# Patient Record
Sex: Male | Born: 1940 | Race: White | Hispanic: No | Marital: Married | Smoking: Never smoker
Health system: Southern US, Community
[De-identification: ages and names within clinical notes are randomized; demographics above are authoritative.]

## PROBLEM LIST (undated history)

## (undated) ENCOUNTER — Emergency Department (HOSPITAL_BASED_OUTPATIENT_CLINIC_OR_DEPARTMENT_OTHER): Admission: EM | Payer: Medicare HMO

## (undated) DIAGNOSIS — I2510 Atherosclerotic heart disease of native coronary artery without angina pectoris: Secondary | ICD-10-CM

## (undated) DIAGNOSIS — E785 Hyperlipidemia, unspecified: Secondary | ICD-10-CM

## (undated) DIAGNOSIS — H409 Unspecified glaucoma: Secondary | ICD-10-CM

## (undated) DIAGNOSIS — K589 Irritable bowel syndrome without diarrhea: Secondary | ICD-10-CM

## (undated) DIAGNOSIS — K8020 Calculus of gallbladder without cholecystitis without obstruction: Secondary | ICD-10-CM

## (undated) DIAGNOSIS — F419 Anxiety disorder, unspecified: Secondary | ICD-10-CM

## (undated) DIAGNOSIS — F32A Depression, unspecified: Secondary | ICD-10-CM

## (undated) DIAGNOSIS — E7800 Pure hypercholesterolemia, unspecified: Secondary | ICD-10-CM

## (undated) DIAGNOSIS — N200 Calculus of kidney: Secondary | ICD-10-CM

## (undated) HISTORY — PX: CORONARY STENT PLACEMENT: SHX1402

## (undated) HISTORY — DX: Anxiety disorder, unspecified: F41.9

## (undated) HISTORY — DX: Irritable bowel syndrome, unspecified: K58.9

## (undated) HISTORY — DX: Calculus of gallbladder without cholecystitis without obstruction: K80.20

## (undated) HISTORY — PX: BACK SURGERY: SHX140

## (undated) HISTORY — DX: Pure hypercholesterolemia, unspecified: E78.00

## (undated) HISTORY — DX: Depression, unspecified: F32.A

## (undated) HISTORY — DX: Calculus of kidney: N20.0

## (undated) HISTORY — PX: CHOLECYSTECTOMY: SHX55

## (undated) HISTORY — DX: Unspecified glaucoma: H40.9

## (undated) HISTORY — DX: Hyperlipidemia, unspecified: E78.5

## (undated) HISTORY — DX: Atherosclerotic heart disease of native coronary artery without angina pectoris: I25.10

---

## 2008-07-25 ENCOUNTER — Inpatient Hospital Stay (HOSPITAL_COMMUNITY): Admission: EM | Admit: 2008-07-25 | Discharge: 2008-07-27 | Payer: Self-pay | Admitting: Emergency Medicine

## 2008-07-25 ENCOUNTER — Ambulatory Visit: Payer: Self-pay | Admitting: Cardiology

## 2008-07-25 ENCOUNTER — Encounter: Payer: Self-pay | Admitting: Cardiology

## 2008-08-08 ENCOUNTER — Ambulatory Visit: Payer: Self-pay | Admitting: Cardiology

## 2008-08-15 ENCOUNTER — Ambulatory Visit: Payer: Self-pay

## 2008-08-15 ENCOUNTER — Ambulatory Visit: Payer: Self-pay | Admitting: Cardiovascular Disease

## 2008-09-03 ENCOUNTER — Ambulatory Visit: Payer: Self-pay | Admitting: Cardiology

## 2008-09-03 LAB — CONVERTED CEMR LAB
ALT: 26 units/L (ref 0–53)
Alkaline Phosphatase: 44 units/L (ref 39–117)
Bilirubin, Direct: 0.1 mg/dL (ref 0.0–0.3)
CO2: 28 meq/L (ref 19–32)
Chloride: 107 meq/L (ref 96–112)
Glucose, Bld: 100 mg/dL — ABNORMAL HIGH (ref 70–99)
Potassium: 4.3 meq/L (ref 3.5–5.1)
Sodium: 141 meq/L (ref 135–145)
Total Protein: 6.7 g/dL (ref 6.0–8.3)

## 2008-09-18 ENCOUNTER — Ambulatory Visit: Payer: Self-pay | Admitting: Cardiovascular Disease

## 2008-09-18 ENCOUNTER — Ambulatory Visit: Payer: Self-pay | Admitting: Cardiology

## 2008-12-04 ENCOUNTER — Ambulatory Visit: Payer: Self-pay | Admitting: Cardiovascular Disease

## 2009-01-01 ENCOUNTER — Ambulatory Visit: Payer: Self-pay | Admitting: Cardiology

## 2009-01-01 LAB — CONVERTED CEMR LAB
AST: 34 units/L (ref 0–37)
Alkaline Phosphatase: 44 units/L (ref 39–117)
LDL Cholesterol: 119 mg/dL — ABNORMAL HIGH (ref 0–99)
Total Bilirubin: 0.7 mg/dL (ref 0.3–1.2)
Total CHOL/HDL Ratio: 6

## 2009-04-09 ENCOUNTER — Ambulatory Visit: Payer: Self-pay | Admitting: Cardiovascular Disease

## 2009-06-20 ENCOUNTER — Encounter: Payer: Self-pay | Admitting: Cardiology

## 2009-06-20 ENCOUNTER — Telehealth (INDEPENDENT_AMBULATORY_CARE_PROVIDER_SITE_OTHER): Payer: Self-pay | Admitting: *Deleted

## 2009-07-22 DIAGNOSIS — I2510 Atherosclerotic heart disease of native coronary artery without angina pectoris: Secondary | ICD-10-CM

## 2009-07-22 DIAGNOSIS — E785 Hyperlipidemia, unspecified: Secondary | ICD-10-CM | POA: Insufficient documentation

## 2009-07-22 DIAGNOSIS — I25110 Atherosclerotic heart disease of native coronary artery with unstable angina pectoris: Secondary | ICD-10-CM | POA: Insufficient documentation

## 2009-07-23 ENCOUNTER — Ambulatory Visit: Payer: Self-pay | Admitting: Cardiovascular Disease

## 2009-07-23 ENCOUNTER — Ambulatory Visit: Payer: Self-pay | Admitting: Cardiology

## 2009-07-28 ENCOUNTER — Ambulatory Visit: Payer: Self-pay | Admitting: Cardiology

## 2009-07-29 LAB — CONVERTED CEMR LAB
CO2: 30 meq/L (ref 19–32)
Calcium: 9.3 mg/dL (ref 8.4–10.5)
Chloride: 109 meq/L (ref 96–112)
Creatinine, Ser: 1.2 mg/dL (ref 0.4–1.5)
Glucose, Bld: 108 mg/dL — ABNORMAL HIGH (ref 70–99)
HDL: 27.6 mg/dL — ABNORMAL LOW (ref >39.00)
LDL Cholesterol: 94 mg/dL (ref 0–99)
Sodium: 143 meq/L (ref 135–145)
Total CHOL/HDL Ratio: 6

## 2009-08-13 ENCOUNTER — Encounter: Payer: Self-pay | Admitting: Cardiology

## 2009-09-19 ENCOUNTER — Encounter: Payer: Self-pay | Admitting: Cardiology

## 2009-10-09 ENCOUNTER — Encounter (INDEPENDENT_AMBULATORY_CARE_PROVIDER_SITE_OTHER): Payer: Self-pay | Admitting: *Deleted

## 2009-10-10 ENCOUNTER — Encounter (INDEPENDENT_AMBULATORY_CARE_PROVIDER_SITE_OTHER): Payer: Self-pay | Admitting: *Deleted

## 2009-12-23 ENCOUNTER — Telehealth: Payer: Self-pay | Admitting: Cardiology

## 2010-01-30 ENCOUNTER — Ambulatory Visit: Payer: Self-pay | Admitting: Cardiovascular Disease

## 2010-01-30 ENCOUNTER — Encounter: Payer: Self-pay | Admitting: Cardiology

## 2010-03-18 ENCOUNTER — Telehealth: Payer: Self-pay | Admitting: Cardiology

## 2010-06-25 ENCOUNTER — Telehealth: Payer: Self-pay | Admitting: Cardiology

## 2010-07-14 ENCOUNTER — Encounter: Payer: Self-pay | Admitting: Cardiovascular Disease

## 2010-07-15 ENCOUNTER — Ambulatory Visit: Payer: Self-pay

## 2010-07-15 ENCOUNTER — Ambulatory Visit: Payer: Self-pay | Admitting: Cardiovascular Disease

## 2010-07-29 ENCOUNTER — Encounter: Payer: Self-pay | Admitting: Cardiology

## 2010-07-31 ENCOUNTER — Ambulatory Visit: Payer: Self-pay | Admitting: Cardiology

## 2010-07-31 ENCOUNTER — Encounter: Payer: Self-pay | Admitting: Cardiology

## 2010-07-31 ENCOUNTER — Ambulatory Visit: Payer: Self-pay | Admitting: Cardiovascular Disease

## 2010-08-01 ENCOUNTER — Emergency Department (HOSPITAL_COMMUNITY): Admission: EM | Admit: 2010-08-01 | Discharge: 2010-08-01 | Payer: Self-pay | Admitting: Family Medicine

## 2010-09-14 ENCOUNTER — Telehealth: Payer: Self-pay | Admitting: Cardiology

## 2010-12-10 ENCOUNTER — Telehealth: Payer: Self-pay | Admitting: Cardiovascular Disease

## 2010-12-29 NOTE — Assessment & Plan Note (Signed)
Summary: per check out      Allergies Added:   Visit Type:  1 year followup Primary Marina Boerner:  None  CC:  No cardiac complaints..  History of Present Illness: The patient is 70 years old and return for management of CAD. In 1999 had a stent to the right coronary artery. 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 He has done well since that time has had no recent chest pain shortness of breath or palpitations.  He has participated in the TRA2P  study and this study finishes today so he took his last dose of study medication today. He is scheduled to have laboratory studies with this today.  He lives at home with his wife. He is getting his Plavix through an assistance program with the company.  Current Medications (verified): 1)  Tra 2p Study Drug .... One Tab By Mouth Once Daily 2)  Plavix 75 Mg Tabs (Clopidogrel Bisulfate) .... Take One Tablet By Mouth Daily 3)  Aspirin 81 Mg Tbec (Aspirin) .... Take One Tablet By Mouth Daily 4)  Simvastatin 80 Mg Tabs (Simvastatin) .... Take Half Tablet By Mouth Daily At Bedtime 5)  Lisinopril 5 Mg Tabs (Lisinopril) .... Take One Tablet By Mouth Daily  Allergies (verified): 1)  ! Iodine  Past History:  Past Medical History: Reviewed history from 07/22/2009 and no changes required. 1. 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 2. Good left ventricular function.  Ejection fraction 60%. 3. Hyperlipidemia with low high-density lipoprotein.   Review of Systems       ROS is negative except as outlined in HPI.   Vital Signs:  Patient profile:   70 year old male Height:      72 inches Weight:      183.50 pounds BMI:      24.98 Pulse rate:   55 / minute BP sitting:   116 / 62  (left arm) Cuff size:   large  Vitals Entered By: Burnett Kanaris, CNA (July 31, 2010 9:34 AM)  Physical Exam  Additional Exam:  Gen. Well-nourished, in no distress   Neck: No JVD, thyroid not enlarged, no carotid bruits Lungs: No tachypnea, clear without rales, rhonchi or wheezes Cardiovascular: Rhythm regular, PMI not displaced,  heart sounds  normal, no murmurs or gallops, no peripheral edema, pulses normal in all 4 extremities. Abdomen: BS normal, abdomen soft and non-tender without masses or organomegaly, no hepatosplenomegaly. 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 His head no chest pain and this problem appears stable. He should remain on Plavix indefinitely. Currently he is getting assistance from the company and they improved a second year. His updated medication list for this problem includes:    Plavix 75 Mg Tabs (Clopidogrel bisulfate) .Marland Kitchen... Take one tablet by mouth daily    Aspirin 81 Mg Tbec (Aspirin) .Marland Kitchen... Take  one tablet by mouth daily    Lisinopril 5 Mg Tabs (Lisinopril) .Marland Kitchen... Take one tablet by mouth daily  Orders: EKG w/ Interpretation (93000)  Problem # 2:  HYPERLIPIDEMIA-MIXED (ICD-272.4) This is managed with simvastatin. We will get lipid profile today. His updated medication list for this problem includes:    Simvastatin 80 Mg Tabs (Simvastatin) .Marland Kitchen... Take half tablet by mouth daily at bedtime  Patient Instructions: 1)  Your physician wants you to follow-up in: 1 year with Dr. Clifton James.  You will receive a reminder letter in the mail two months in advance. If you don't receive a letter, please call our office to schedule the follow-up appointment. 2)  Your physician  recommends that you continue on your current medications as directed. Please refer to the Current Medication list given to you today.

## 2010-12-29 NOTE — Progress Notes (Signed)
Summary: Plavix- [facility]   Phone Note Outgoing Call   Call placed by: Sherri Rad, RN, BSN,  June 25, 2010 4:38 PM Call placed to: Patient Summary of Call: I called and spoke with the pt's wife. She is aware the pt's plavix is here from [facility] and at the front desk. Initial call taken by: Sherri Rad, RN, BSN,  June 25, 2010 4:38 PM

## 2010-12-29 NOTE — Progress Notes (Signed)
Summary: Plavix   Phone Note Outgoing Call   Call placed by: Sherri Rad, RN, BSN,  December 23, 2009 10:32 AM Call placed to: Patient Summary of Call: I called and spoke with the pt's wife and made her aware that the pt's plavix has arrived from [facility] and that it is at the front desk for p/u. Initial call taken by: Sherri Rad, RN, BSN,  December 23, 2009 10:33 AM

## 2010-12-29 NOTE — Progress Notes (Signed)
Summary: Plavix- [facility]    Phone Note Outgoing Call   Call placed by: Sherri Rad, RN, BSN,  September 14, 2010 1:41 PM Call placed to: Patient Summary of Call: I attempted to call the pt and let him know that his plavix has arrived from [facility]- no answer x 10 rings. I will call back. Sherri Rad, RN, BSN  September 14, 2010 1:41 PM   The pt's wife is aware his Plavix has arrived from [facility].  Initial call taken by: Sherri Rad, RN, BSN,  September 14, 2010 2:39 PM

## 2010-12-29 NOTE — Miscellaneous (Signed)
  Clinical Lists Changes  Observations: Added new observation of RESULTS MISC: Lower Arterial examination   No evidence of segmental lower extremity arterial disease at rest . bilateral. Normal ABI's. (07/15/2010 15:03)      MISC. Report  Procedure date:  07/15/2010  Findings:      Lower Arterial examination   No evidence of segmental lower extremity arterial disease at rest . bilateral. Normal ABI's.

## 2010-12-29 NOTE — Miscellaneous (Signed)
Summary: Orders Update  Clinical Lists Changes  Orders: Added new Test order of Arterial Duplex Lower Extremity (Arterial Duplex Low) - Signed 

## 2010-12-29 NOTE — Progress Notes (Signed)
Summary: Plavix   Phone Note Outgoing Call   Call placed by: Sherri Rad, RN, BSN,  March 18, 2010 5:14 PM Call placed to: Patient Summary of Call: I called and spoke with the pt's wife and made her aware the pt's Plavix has arrived from [facility] and is at the front desk for p/u. Initial call taken by: Sherri Rad, RN, BSN,  March 18, 2010 5:15 PM

## 2010-12-31 NOTE — Progress Notes (Signed)
Summary: Plavix- [facility]   Phone Note Outgoing Call   Call placed by: Sherri Rad, RN, BSN,  December 10, 2010 10:42 AM Call placed to: Patient Summary of Call: I spoke with the pt's wife and made her aware his plavix has arrived in the office and is at the front desk.  Initial call taken by: Sherri Rad, RN, BSN,  December 10, 2010 10:42 AM

## 2011-03-02 ENCOUNTER — Telehealth: Payer: Self-pay | Admitting: Cardiovascular Disease

## 2011-03-03 MED ORDER — SIMVASTATIN 80 MG PO TABS: 40.0000 mg | ORAL_TABLET | Freq: Every day | ORAL | Status: DC

## 2011-03-03 NOTE — Telephone Encounter (Signed)
done

## 2011-03-09 ENCOUNTER — Telehealth: Payer: Self-pay | Admitting: Cardiovascular Disease

## 2011-03-09 MED ORDER — CLOPIDOGREL BISULFATE 75 MG PO TABS: 75.0000 mg | ORAL_TABLET | Freq: Every day | ORAL | Status: AC

## 2011-03-09 NOTE — Telephone Encounter (Signed)
Sent refill into pharmacy

## 2011-03-10 ENCOUNTER — Telehealth: Payer: Self-pay | Admitting: Cardiovascular Disease

## 2011-03-10 MED ORDER — SIMVASTATIN 80 MG PO TABS: ORAL_TABLET | ORAL | Status: DC

## 2011-03-22 ENCOUNTER — Telehealth: Payer: Self-pay | Admitting: *Deleted

## 2011-03-22 NOTE — Telephone Encounter (Signed)
Pt wife aware plavix at the front desk for pick up [name]

## 2011-04-13 NOTE — Assessment & Plan Note (Signed)
[facility]                            CARDIOLOGY OFFICE NOTE   NAME:Plantz, LIONAL ICENOGLE                     MRN:          [id number]  DATE:09/18/2008                            DOB:          1941-06-28    ADDENDUM:  Mr. Kinker signed up for a TRA-2P study drug, so he is on the TRA drug or  placebo on a daily basis.     Bruce Elvera Lennox Juanda Chance, MD, [facility]     BRB/MedQ  DD: 09/18/2008  DT: 09/19/2008  Job #: [id number]

## 2011-04-13 NOTE — Assessment & Plan Note (Signed)
[facility]                            CARDIOLOGY OFFICE NOTE   NAME:[name], [name]                     MRN:          [id number]  DATE:08/08/2008                            DOB:          1940-12-31    PRIMARY CARE PHYSICIAN:  None.   CLINICAL HISTORY:  [name] is a 70 year old and returned for  management of his coronary heart disease after his recent diaphragmatic  wall infarction.  He had a Multi-Link stent placed in the right coronary  artery in 1999.  He had the onset of chest pain and was driven by his  wife to the emergency room where the ECG showed a diaphragmatic wall  infarction.  He went promptly to the catheterization laboratory and  underwent reperfusion.  We used Fetch aspiration catheter because of  filling defect after opening the stent, and this resulted in a small  dissection.  We then performed ultrasound, which documented there was a  dissection within the stent and also quite a bit of tissue growth within  the stent.  Based on these findings, we decided to treat this with 2  overlapping drug-eluting stents, which was a 3.5 x 23-mm Promus and a  second 3.5 x 23-mg Promus.  He did quite well after that.  He has had no  recurrent chest pain, palpitation, or shortness of breath since  discharge.   PAST MEDICAL HISTORY:  Significant for hyperlipidemia and borderline  glucose intolerance.   CURRENT MEDICATIONS:  1. Aspirin.  2. Plavix.  3. Lisinopril 5 mg daily.  4. Simvastatin 80 mg daily.   SOCIAL HISTORY:  He lives with his wife.  He does not smoke.  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   PHYSICAL EXAMINATION:  VITAL SIGNS:  Today, blood pressure is 124/78 and  pulse 54 and regular.  NECK:  There was no venous distention.  The carotid pulses were full  without bruits.  CHEST:  Clear.  HEART:  Rhythm is regular.  There are no murmurs or gallops.  ABDOMEN:   Soft without organomegaly.  EXTREMITIES:  Peripheral pulses are full with no peripheral edema.   Electrocardiogram showed inferior T-wave changes.   IMPRESSION:  1. Coronary artery disease, status post stenting of the right coronary      artery in 1999, and with recent diaphragmatic wall infarction due      to stent thrombosis treated with 2 new Promus drug-eluting stents,      now stable.  2. Good left ventricular function with ejection fraction 60%  3. Hyperlipidemia.   RECOMMENDATIONS:  I think [name] is doing well.  With 1 stent  thrombosis and new stents placed, he is at increased risk for recurrent  stent thrombosis, and I emphasize this to him and his wife, and told the  importance of continuing on the aspirin and Plavix.  I had considered  putting him on prasugrel, but he has no insurance to pay for medicines  at all, and I think the cost would be prohibitive.  He does have  Medicare, but this is not paying for his medications.  We think he has  applied for assistance through [employer] to help pay for his Plavix.  I also talked to him about TRA2P and he is interested in this, and we  have been just talking to him about this today.  We will have him come  in about 4 weeks for lipid and liver profile and BMP.  I will plan to  see him back in 6 weeks.     Bruce Elvera Lennox Juanda Chance, MD, [facility]  Electronically Signed    BRB/MedQ  DD: 08/08/2008  DT: 08/09/2008  Job #: [id number]

## 2011-04-13 NOTE — Assessment & Plan Note (Signed)
[facility]                            CARDIOLOGY OFFICE NOTE   NAME:[name], [name]                     MRN:          [id number]  DATE:09/18/2008                            DOB:          05/22/1941    PRIMARY CARE PHYSICIAN:  None.   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  He had a  history of coronary artery disease with a Multi-Link stent placed in the  right coronary artery in 1999.  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   He has done well since that time and had no recurrent chest pain,  shortness breath, or palpitations.   PAST MEDICAL HISTORY:  Significant for hyperlipidemia and borderline  glucose intolerance.   CURRENT MEDICATIONS:  Plavix, aspirin, lisinopril 5 mg daily, and  simvastatin 80 mg daily.   PHYSICAL EXAMINATION:  VITAL SIGNS:  Blood pressure is 122/81, the pulse  53 and regular.  NECK:  There was no venous distention.  Carotid pulses were full without  bruits.  CHEST:  Clear.  CARDIOVASCULAR:  Rhythm is regular.  There is no murmurs or gallops.  ABDOMEN:  Soft, normal bowel sounds.  Peripheral pulses are full.  There  is no peripheral edema.   LABORATORY DATA:  His recent labs showed normal renal function and  normal liver function tests.  His HDL of 25.  His LDL was 77 and his  total cholesterol was 136.   IMPRESSION:  1. Coronary artery disease, status post stenting of the right coronary      in 1999, with a bare-metal stent with recent diaphragmatic wall      infarction due to stent thrombosis 10 years after initial stent      implant, treated with 2 new overlapping Palmaz stent in the right      coronary artery.  2. Good left ventricular  function with ejection fraction of 60%.  3. Hyperlipidemia with low HDL.   RECOMMENDATIONS:  I think [name] is doing well.  A big issue is his  Plavix.  We have given him 2 weeks supply of samples up to this point.  He may not qualify for [employer] plan since he has Medicare,  although he has not applied, he has not signed up for the Medicare drug  plan.  We will ask him to fill the prescription, and see if he is  eligible for the [employer] plan or if he wants to consider signing  up for the Medicare drug plan.  I will plan to see him back in 4 months.   ADDENDUM:  [name] signed up for a TRA-2P study drug, so he is on the  TRA drug or placebo on a daily basis.     Bruce Elvera Lennox Juanda Chance, MD, [facility]  Electronically Signed    BRB/MedQ  DD: 09/18/2008  DT: 09/18/2008  Job #: [id number]

## 2011-04-13 NOTE — Cardiovascular Report (Signed)
NAMERONEY, YOUTZ NO.:  [id number]   MEDICAL RECORD NO.:  [id number]          PATIENT TYPE:  INP   LOCATION:  2807                         FACILITY:  [facility]   PHYSICIAN:  Everardo Beals. Juanda Chance, MD, FACCDATE OF BIRTH:  Jan 10, 1941   DATE OF PROCEDURE:  DATE OF DISCHARGE:                            CARDIAC CATHETERIZATION   Cardiac Catheterization And Percutaneous Intervention   CLINICAL HISTORY:  Mr. Brodrick is 70 years old and has had previous some  Multi-Link stent placed to the right coronary in 1999.  He had the onset  of chest pain at 5 a.m. this morning and was driven by his wife to the  emergency room where his ECG showed an acute diaphragmatic wall  infarction.  He was given 5000 units of heparin and 4 chewable aspirin  and transferred promptly to the catheterization laboratory.  He does not  smoke.  He does not have a primary physician and had not seen Dr. Allyson Sabal  since 2000.   PROCEDURE:  The procedure was performed by the right femoral arterial  sheath and 6-French preformed coronary catheters.  After taking a  diagnostic picture of the left coronary artery, we went in with a JR-4 6-  [place] guiding catheter with side holes and identified a totally  occluded right coronary artery.  The patient was given Angiomax bolus  and infusion and was given 600 mg of Plavix and 20 mg of Pepcid.  We  crossed the lesion in the proximal right coronary artery at the start of  a previously placed stent with a Prowater wire without difficulty.  We  initially predilated with a 2.0 x 20-mm apex balloon performing 1  inflation of 8 atmospheres for 30 seconds.  This established  reperfusion, but there was a large filling defect.  For this reason, we  then went in with a fetch aspiration catheter and performed 4 runs and  were able to retrieve some thrombus from the aspiration.  However, the  postaspiration angiogram showed what appeared to be a dissection.  We  then performed  intravascular ultrasound with an Atlantis IVUS catheter  passing this distal to the proximal lesion and the lesion in the  midvessel.  We did automatic pullback.  This documented that there was  some dissection within the stent in the proximal vessel which had 2  lumens.  The stent itself was difficult to visualize, but appeared to be  well expanded.  Based on the findings of the IVUS, we decided to treat  this with 2 overlapping drug-eluting stents 3.5 mm in diameter.  We  first went in with a 3.5 x 23-mm PROMUS and deployed this in the  proximal lesion with 1 inflation up to 12 atmospheres for 30 seconds.  We then deployed a second 3.5 x 23-mm PROMUS overlapping the first and  ending just before the right ventricular branch.  We then deployed this  with 1 inflation up to 12 atmospheres for 30 seconds.  We then  postdilated both lesions with a 3.75 x 20-mm [employer] Voyager performing 3  inflations up to 15 atmospheres for 30 seconds.  Final diagnosis was  then performed through the Guidant catheter.   Following the intervention, we did a left ventriculogram.  We then  closed the right femoral artery with Angio-Seal.  The patient tolerated  the procedure well and left the laboratory in satisfactory condition.   RESULTS:  The aortic pressure was 123/78 with mean of 91.  The left  ventricular pressure was 123/15.   The left main coronary artery:  The left main coronary artery was free  of significant disease.   The left anterior ascending artery:  The left anterior descending artery  gave rise to 2 diagonal branches and a septal perforator.  The LAD was  irregular and there was 50% narrowing in the midvessel.   The circumflex artery:  The circumflex artery gave rise to a marginal  branch and a small AV branch.  There was 50% narrowing in the proximal  and ostial portion of the marginal branch.   The right coronary artery:  The right coronary artery was completely  occluded proximally at  the side of the previously placed stent.   The left ventriculogram:  The left ventriculogram performed in the RAO  projection showed akinesis of the inferobasal wall.  The overall wall  motion was good with an estimated ejection fraction of 60%.   Following a reperfusion, we identified a 90% lesion in the mid right  coronary artery and a 40% lesion in the mid distal right coronary  artery.  Distal vessel consisted of a short posterior descending branch  and 2 moderate-sized posterolateral branches.  The acute marginal  branches supplied part of the inferior septum.   Following placement of 2 overlapping stents in the proximal and mid, the  right coronary stenoses improved from 190% to 0% and the flow improved  from TIMI-0 to TIMI-3 flow.   The patient had the onset of chest pain at 5 a.m. and arrived in the  emergency room by private vehicle at 0845.  The first balloon inflation  was performed at 9:32.  This gave a door balloon time of 47 minutes and  reperfusion time of 4 hours and 32 minutes.   CONCLUSION:  1. Acute diaphragmatic wall infarction due to very late stent      thrombosis within a bare-metal stent in the proximal right coronary      artery with total occlusion of proximal right coronary artery, 50%      narrowing in the large branches of circumflex artery, 50% narrowing      in the mid left anterior descending, and inferobasal wall akinesis.  2. Successful percutaneous coronary intervention of the totally      occluded right coronary artery (stent thrombosis) using aspiration      thrombectomy, intracoronary vascular ultrasound guidance, and 2      overlapping PROMUS drug-eluting stents with improvement in center      narrowing from 100% to 0% improvement in the flow from TIMI-0 to      TIMI-3 flow.   DISPOSITION:  The patient then presents for further observation.   PLAN:  Plan to recommend long-term Plavix.      Bruce Elvera Lennox Juanda Chance, MD, [facility]  Electronically  Signed     BRB/MEDQ  D:  07/25/2008  T:  07/26/2008  Job:  [id number]   cc:   Cardiopulmonary Lab

## 2011-04-13 NOTE — Assessment & Plan Note (Signed)
[facility]                            CARDIOLOGY OFFICE NOTE   NAME:[name], [name]                     MRN:          [id number]  DATE:01/01/2009                            DOB:          1941/05/13    PRIMARY CARE PHYSICIAN:  None.   CLINICAL HISTORY:  [name] is a 70 year old and returned for followup  management of coronary heart disease.  He had a Multilink stent placed  in the right coronary in 1999.  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  He has done quite well since that time.  He has  had no chest pain, shortness of breath, or palpitations.   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  He is applying for the  [employer] health plan with his Plavix, but he need a documentation  of his income and so he is awaited on that.   His past medical history is significant for hyperlipidemia with low HDL  and borderline glucose intolerance.   MEDICATIONS:  His current medications include,  1. Plavix.  2. Lisinopril 5 mg daily.  3. Aspirin.  4. __________ drug.  5. Simvastatin 80 mg which was decreased to 40 mg because of abnormal      liver function tests.   PHYSICAL EXAMINATION:  VITAL SIGNS:  Blood pressure 130/80 and pulse 68  and regular.  NECK:  There was no venous distension.  The carotid pulses were full  without bruits.  CHEST:  Clear.  CARDIAC:  Rhythm was regular.  He had no murmurs or gallops.  ABDOMEN:  Soft with normal bowel sounds.  EXTREMITIES:  Peripheral pulses full with no peripheral edema.   IMPRESSION:  1. Coronary artery disease, status post remote stenting of the right      coronary in 1999 with bare metal stent and status post      diaphragmatic wall infarction due to stent  thrombosis 10 years      after initial implant, July 25, 2008 treated with 2 overlapping      drug-eluting stents.  2. Good left ventricular function.  Ejection fraction 6%.  3. Hyperlipidemia with low high-density lipoprotein.   RECOMMENDATIONS:  [name] doing well.  He is currently paying for his  Plavix out of pocket.  He had some samples today and he is getting  documentation to apply for [employer] assistance program.  I will  continue with same medications, see him back in 6 months.     Keith Elvera Lennox Juanda Chance, MD, [facility]  Electronically Signed    BRB/MedQ  DD: 01/01/2009  DT: 01/01/2009  Job #: [id number]

## 2011-04-13 NOTE — H&P (Signed)
[name], [name] NO.:  [id number]   MEDICAL RECORD NO.:  [id number]          PATIENT TYPE:  INP   LOCATION:  2904                         FACILITY:  [facility]   PHYSICIAN:  Everardo Beals. Juanda Chance, MD, FACCDATE OF BIRTH:  [birth year]   DATE OF ADMISSION:  07/25/2008  DATE OF DISCHARGE:                              HISTORY & PHYSICAL   PRIMARY CARDIOLOGIST:  Delora Fuel. Juanda Chance, MD, [facility] (formerly Dr.  Nanetta Batty approximately 10 years ago).   PRIMARY CARE PHYSICIAN:  The patient does not have one.   HISTORY OF PRESENT ILLNESS:  A 70 year old Caucasian male with a history  of coronary artery disease status post 2 stents placed to unknown  arteries in 1998 per Dr. Nanetta Batty, one is presumed to be in the  right coronary artery.  Records are being requested that are on  microfiche.  The patient began to experience profound diaphoresis, and  crushing chest pain around 5 a.m. this morning which awoke him.  The  patient took 2 baby aspirin and awoke his wife who drove him to [facility].  The patient was in the ER at 8:45 a.m.  EKG in the ER  revealed ST elevation in leads II, III, and aVF.  The patient was  brought emergently to Cardiac Catheterization Lab and his arrival at 9  a.m.  The patient is continued to have chest pressure.  His breathing  status is normal.  He is denying any further diaphoresis.  He is not  nauseated.  The patient has not had any cardiology followup since 1999.  He did follow with Dr. Allyson Sabal 1 year postprocedure but had not followed  up since.  He has not had any other past medical history and he is not  on any medications at home.   REVIEW OF SYSTEMS:  Positive for diaphoresis, chest pain, shortness of  breath, and mild nausea.   PAST MEDICAL HISTORY:  CAD with 2 stents to unknown arteries (presumed  one stent in the right coronary artery).   PAST SURGICAL HISTORY:  Cholecystectomy.   SOCIAL HISTORY:  He lives in [place]  with his wife.  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  He does not smoke, he does not drink alcohol.  He has no drug use.   FAMILY HISTORY:  Unobtainable at present.   CURRENT MEDICATIONS:  None.   CURRENT ALLERGIES:  IODINE causing rash.   CURRENT LABORATORIES:  Sodium 138, potassium 3.8, chloride 106, CO2 21,  BUN 22, creatinine 1.2, glucose 519, and hemoglobin 15.   PHYSICAL EXAMINATION:  VITAL SIGNS:  Blood pressure 155/103, heart rate  is 60, respirations 22, and O2 sat 98% on 2 L.  HEENT:  Head is normocephalic and atraumatic.  EYES:  PERRLA.  Mucous membranes of mouth pink and moist.  Tongue is  midline.  NECK:  Supple.  The patient's airway assessed, but reveals a class III.  CARDIOVASCULAR:  Regular rate and rhythm without murmurs, rubs, or  gallops.  There are no carotid bruits.  No JVD seen.  LUNGS:  Essentially clear to  auscultation anterolaterally.  ABDOMEN:  Soft and nontender, 2+ bowel sounds.  EXTREMITIES:  Without clubbing, cyanosis, or edema.  NEUROLOGIC:  Cranial nerves II through XII are grossly intact.   EKG reveals sinus bradycardia with acute inferior ST elevated MI in  leads II, III, and aVF.   IMPRESSION:  1. Acute inferior myocardial infarction.  2. Coronary artery disease, status post stents to 2 vessels in 1998,      per Dr. Nanetta Batty, one is presumed to be in the right coronary      artery, records are being requested which are on microfilm for      verification.   PLAN:  The patient was brought emergently to Cardiac Catheterization Lab  for immediate intervention, more recommendations per Dr. Juanda Chance.  The  patient will be placed on beta-blocker, statin, Plavix, aspirin, and ACE  inhibitor unless contraindications are seen.  More recommendations  throughout hospital course is pending on patient's response to  treatment.      Bettey Mare. Lyman Bishop, NP      Everardo Beals. Juanda Chance, MD, [facility]  Electronically Signed    KML/MEDQ  D:   07/25/2008  T:  07/25/2008  Job:  [id number]

## 2011-04-13 NOTE — Discharge Summary (Signed)
[name], [name] NO.:  [id number]   MEDICAL RECORD NO.:  [id number]          PATIENT TYPE:  INP   LOCATION:  2032                         FACILITY:  [facility]   PHYSICIAN:  Bevelyn Buckles. Bensimhon, MDDATE OF BIRTH:  May 22, 1941   DATE OF ADMISSION:  07/25/2008  DATE OF DISCHARGE:  07/27/2008                               DISCHARGE SUMMARY   PRIMARY CARDIOLOGIST:  Everardo Beals. Juanda Chance, MD, [facility].   PRIMARY CARE PHYSICIAN:  None.   DISCHARGE DIAGNOSIS:  Acute inferior ST-segment elevation myocardial  infarction.   SECONDARY DIAGNOSES:  1. Coronary artery disease, status post previous stenting of the RCA      in 1998.  2. Hyperlipidemia.  3. Impaired fasting glucose.   ALLERGIES:  IODINE causes a rash.   PROCEDURES:  Left heart cardiac catheterization with successful PCI and  stenting of the right coronary artery with placement of two 3.25 x 23 mm  PROMUS drug-eluting stents.  EF of 60% with inferior akinesis.   HISTORY OF PRESENT ILLNESS:  A 70 year old Caucasian male with prior  cardiac history, status post stenting of the RCA x2 in 1998.  He was in  his usual state of health until approximately 2 a.m. on July 20, 2008,  when he awoke from sleep with profound diaphoresis and crushing chest  pressure.  He took 2 baby aspirin at home, and his wife subsequently  drove him into the [facility] ED.  In the ED, ECG showed ST elevation in  leads II, III, and aVF and the patient was taken emergently to the cath  lab for management of the acute inferior ST-segment elevation myocardial  infarction.   HOSPITAL COURSE:  The patient underwent emergent cardiac catheterization  revealing totally occluded proximal RCA within previously placed stent  suggestive of very late stent thrombosis.  There is also 90% stenosis in  the mid RCA.  The patient was successfully treated with two 3.5 x 23 mm  PROMUS drug-eluting stents.  EF was 60% with inferior akinesis.  The  patient with  nonobstructive coronary artery disease.  [name] was  transferred to the coronary intensive care unit where he eventually  peaked his CK at 1488, MB at 182.8, and troponin I at 35.79.   Post PCI, [name] exhibited baseline bradycardia with rates in the 40s-  50s, thus preventing [place] from initiating beta-blocker therapy.  We did  initiate aspirin, Plavix, high-dose statin, and ACE inhibitor therapy;  all of which he has tolerated.   [name] was evaluated by cardiac rehabilitation and has been  ambulating without recurrent symptoms with limitations.  He was  transferred out to the floor on July 26, 2008, and is ready for  discharge this morning.   DISCHARGE LABORATORY DATA:  Hemoglobin 14.4, hematocrit 42.0, WBC 7.2,  platelets 144,000, and MCV 92.9.  Sodium 139, potassium 4.5, chloride  106, CO2 29, BUN 12, creatinine 1.17, glucose 121, INR 1.3, total  bilirubin 0.6, alkaline phosphatase 39, AST 51, ALT 23, albumin 3.2, CK  1107, MB 110.5, troponin I 22.74, total cholesterol 202, triglycerides  50, HDL 23, LDL  168, and calcium 8.8.  Hemoglobin A1c is pending.  TSH  0.725.   DISPOSITION:  The patient is being discharged home today in good  condition.   FOLLOWUP PLANS AND APPOINTMENT:  We have arranged for followup with Dr.  Charlies Constable on August 08, 2008 at 3:45 p.m.  We have recommended  that he obtain a primary care [name], and follow up within the next 4-  5 weeks.   DISCHARGE MEDICATIONS:  1. Aspirin 325 mg daily.  2. Plavix 75 mg daily.  3. Lisinopril 5 mg daily.  4. Nitroglycerin 0.4 mg sublingual p.r.n. chest pain.  5. Simvastatin 80 mg nightly.   We have tried to make his medicines as affordable as possible as he had  no prescription coverage.   OUTSTANDING LABORATORY STUDIES:  None.   DURATION OF DISCHARGE/ENCOUNTER:  Sixty minutes including physician  time.      Nicolasa Ducking, ANP      Bevelyn Buckles. Bensimhon, MD  Electronically Signed     CB/MEDQ  D:  07/27/2008  T:  07/27/2008  Job:  [id number]

## 2011-05-27 ENCOUNTER — Telehealth: Payer: Self-pay | Admitting: *Deleted

## 2011-05-27 NOTE — Telephone Encounter (Signed)
ATTEMPTED TO REACH PT TO INFORM PLAVIX HERE FOR PICK UP. WILL TRY LATER./CY

## 2011-05-31 NOTE — Telephone Encounter (Signed)
ATTEMPTED TO REACH PT AGAIN NO ANSWER./CY

## 2011-06-07 ENCOUNTER — Telehealth: Payer: Self-pay | Admitting: Cardiology

## 2011-06-07 NOTE — Telephone Encounter (Signed)
Spoke with patient. Will leave his Plavix at the front desk.

## 2011-06-07 NOTE — Telephone Encounter (Signed)
UNABLE TO REACH PT AGAIN./CY

## 2011-07-06 ENCOUNTER — Encounter: Payer: Self-pay | Admitting: Cardiovascular Disease

## 2011-07-30 ENCOUNTER — Encounter: Payer: Self-pay | Admitting: *Deleted

## 2011-08-03 ENCOUNTER — Ambulatory Visit (INDEPENDENT_AMBULATORY_CARE_PROVIDER_SITE_OTHER): Payer: Self-pay | Admitting: Cardiovascular Disease

## 2011-08-03 ENCOUNTER — Encounter: Payer: Self-pay | Admitting: Cardiovascular Disease

## 2011-08-03 VITALS — BP 122/80 | HR 64 | Ht 71.0 in | Wt 174.1 lb

## 2011-08-03 DIAGNOSIS — E785 Hyperlipidemia, unspecified: Secondary | ICD-10-CM

## 2011-08-03 DIAGNOSIS — I2510 Atherosclerotic heart disease of native coronary artery without angina pectoris: Secondary | ICD-10-CM

## 2011-08-03 NOTE — Assessment & Plan Note (Signed)
Stable. Continue current medications including ASA/Plavix and statin.

## 2011-08-03 NOTE — Patient Instructions (Signed)
Fasting Lipid and Liver panel this week. We will call you with results.   Your physician wants you to follow-up in12 months* You will receive a reminder letter in the mail two months in advance. If you don't receive a letter, please call our office to schedule the follow-up appointment.

## 2011-08-03 NOTE — Progress Notes (Signed)
History of Present Illness:70 yo WM with history of CAD, HLD here today for cardiac follow up. He has been followed in the past by Dr. Juanda Chance.  In 1999 he had a stent to the right coronary artery. 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 ABI 2011 normal.  He has done well since that time has had no recent chest pain,  shortness of breath or palpitations. He retired from his own business. He has been taking all of his medications.   Last lipid profile September 2011: Total chol: 168, HDL 36, LDL 107.    Past Medical History  Diagnosis Date  . 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 Hyperlipidemia     with low high-density lipoprotein    Past Surgical History  Procedure Date  . Cardiac catheterization     Current Outpatient Prescriptions  Medication Sig Dispense Refill  . aspirin 81 MG tablet Take 81 mg by mouth daily.        . clopidogrel (PLAVIX) 75 MG tablet Take 1 tablet (75 mg total) by mouth daily.  30 tablet  11  . lisinopril (PRINIVIL,ZESTRIL) 5 MG tablet Take 5 mg by mouth daily.        . simvastatin (ZOCOR) 80 MG tablet 1/2 tab po qd  30 tablet  6    Allergies  Allergen Reactions  . Iodine     History   Social History  . Marital Status: Married    Spouse Name: N/A    Number of Children: N/A  . Years of Education: N/A   Occupational History  . Not on file.   Social History Main Topics  . Smoking status: Never Smoker   . Smokeless tobacco: Not on file  . Alcohol Use: No  . Drug Use: No  . Sexually Active:    Other Topics Concern  . Not on file   Social History Narrative  . No narrative on file    No family history on file.  Review of Systems:  As stated in the HPI and otherwise negative.   BP 122/80   Pulse 64  Ht 5\' 11"  (1.803 m)  Wt 174 lb 1.9 oz (78.98 kg)  BMI 24.28 kg/m2  Physical Examination: General: Well developed, well nourished, NAD HEENT: OP clear, mucus membranes moist SKIN: warm, dry. No rashes. Neuro: No focal deficits Musculoskeletal: Muscle strength 5/5 all ext Psychiatric: Mood and affect normal Neck: No JVD, no carotid bruits, no thyromegaly, no lymphadenopathy. Lungs:Clear bilaterally, no wheezes, rhonci, crackles Cardiovascular: Regular rate and rhythm. No murmurs, gallops or rubs. Abdomen:Soft. Bowel sounds present. Non-tender.  Extremities: No lower extremity edema. Pulses are 2 + in the bilateral DP/PT.  EKG:NSR, rate 65 bpm. Normal EKG.

## 2011-08-03 NOTE — Assessment & Plan Note (Signed)
Continue statin. Check lipids/LFTs this week.

## 2011-08-05 ENCOUNTER — Ambulatory Visit (INDEPENDENT_AMBULATORY_CARE_PROVIDER_SITE_OTHER): Payer: Self-pay | Admitting: *Deleted

## 2011-08-05 DIAGNOSIS — E785 Hyperlipidemia, unspecified: Secondary | ICD-10-CM

## 2011-08-05 LAB — HEPATIC FUNCTION PANEL
ALT: 17 U/L (ref 0–53)
AST: 15 U/L (ref 0–37)
Albumin: 3.8 g/dL (ref 3.5–5.2)
Alkaline Phosphatase: 42 U/L (ref 39–117)
Bilirubin, Direct: 0.1 mg/dL (ref 0.0–0.3)
Total Protein: 6.9 g/dL (ref 6.0–8.3)

## 2011-08-05 LAB — LIPID PANEL
HDL: 33.8 mg/dL — ABNORMAL LOW (ref >39.00)
Total CHOL/HDL Ratio: 5

## 2011-08-06 ENCOUNTER — Other Ambulatory Visit: Payer: Self-pay | Admitting: *Deleted

## 2011-08-06 MED ORDER — LISINOPRIL 5 MG PO TABS: 5.0000 mg | ORAL_TABLET | Freq: Every day | ORAL | Status: DC

## 2012-02-04 ENCOUNTER — Other Ambulatory Visit: Payer: Self-pay | Admitting: Cardiovascular Disease

## 2012-02-04 MED ORDER — LISINOPRIL 5 MG PO TABS: 5.0000 mg | ORAL_TABLET | Freq: Every day | ORAL | Status: DC

## 2012-02-04 NOTE — Telephone Encounter (Signed)
Only two pills left  °

## 2012-03-21 ENCOUNTER — Other Ambulatory Visit: Payer: Self-pay | Admitting: Cardiovascular Disease

## 2012-06-18 ENCOUNTER — Encounter (HOSPITAL_COMMUNITY): Payer: Self-pay

## 2012-06-18 ENCOUNTER — Emergency Department (INDEPENDENT_AMBULATORY_CARE_PROVIDER_SITE_OTHER)
Admission: EM | Admit: 2012-06-18 | Discharge: 2012-06-18 | Disposition: A | Discharge status: Home / Self Care | Payer: Medicare Other | Source: Home / Self Care | Attending: Emergency Medicine | Admitting: Emergency Medicine

## 2012-06-18 DIAGNOSIS — R339 Retention of urine, unspecified: Secondary | ICD-10-CM

## 2012-06-18 DIAGNOSIS — N390 Urinary tract infection, site not specified: Secondary | ICD-10-CM

## 2012-06-18 HISTORY — DX: Calculus of kidney: N20.0

## 2012-06-18 LAB — POCT URINALYSIS DIP (DEVICE)
Bilirubin Urine: NEGATIVE
Glucose, UA: NEGATIVE mg/dL
Leukocytes, UA: NEGATIVE
Nitrite: NEGATIVE
Protein, ur: NEGATIVE mg/dL
Urobilinogen, UA: 0.2 mg/dL (ref 0.0–1.0)
pH: 5 (ref 5.0–8.0)

## 2012-06-18 LAB — OCCULT BLOOD, POC DEVICE: Fecal Occult Bld: NEGATIVE

## 2012-06-18 MED ORDER — CIPROFLOXACIN HCL 500 MG PO TABS: 500.0000 mg | ORAL_TABLET | Freq: Two times a day (BID) | ORAL | Status: AC

## 2012-06-18 NOTE — ED Notes (Signed)
Catheter inserted without difficulty   Immediate return of urine - foley clamped

## 2012-06-18 NOTE — ED Notes (Signed)
Leg bag removed  1200 ml of clear yellow urine

## 2012-06-18 NOTE — ED Notes (Signed)
Pt states that he has had urinary frequency/retention with pain since last night. States that 2 wks ago had back pain for 4-days that resolved. Per pt since last night voided frequent small amts with burning. Abd distended and firm. Pain with palpation. Urine sample collected-small amt of voided

## 2012-06-18 NOTE — ED Notes (Signed)
Catheter unclamped and  Draining

## 2012-06-18 NOTE — ED Notes (Signed)
Leg bag in place and instructions reviewed

## 2012-06-19 NOTE — ED Provider Notes (Signed)
Chief Complaint  Patient presents with  . Urinary Frequency  . Groin Pain  . Groin Swelling  . Urinary Retention    History of Present Illness:   The patient is a 71 year old male who since last night has only been able to produce frequent small amounts of urine. This only must would level at a time. He's had some burning. He denies any fever or chills. No lower back or lower abdominal pain. No genital pain or swelling. He's a little bit nauseated but no vomiting. He denies a prior history of prostate problems or difficulty urinating. He's not taking any new medications right now.  Review of Systems:  Other than noted above, the patient denies any of the following symptoms: General:  No fevers, chills, sweats, aches, or fatigue. GI:  No abdominal pain, back pain, nausea, vomiting, diarrhea, or constipation. GU:  No dysuria, frequency, urgency, hematuria, urethral discharge, penile lesions, penile pain, testicular pain, swelling, or mass, inguinal lymphadenopathy or incontinence.  PMFSH:  Past medical history, family history, social history, meds, and allergies were reviewed.  Physical Exam:   Vital signs:  BP 138/85  Pulse 87  Temp 99.4 F (37.4 C) (Oral)  Resp 19  SpO2 100% Gen:  Alert, oriented, in no distress. Lungs:  Clear to auscultation, no wheezes, rales or rhonchi. Heart:  Regular rhythm, no gallop or murmer. Abdomen:  Flat and soft.  He has tenderness to palpation in the suprapubic area and the bladder appears to be distended and tender to palpation.  No hepato-splenomegaly or mass.  Bowel sounds were normally active.  No hernia. Genital exam:  Normal genital exam. No lesions, swelling, hernia, or testicular masses. Rectal exam:  Normal digital rectal examination with normal size prostate without tenderness or nodules, no other masses, no tenderness, stool is heme negative. Back:  No CVA tenderness.  Skin:  Clear, warm and dry.  Labs:   Results for orders placed during the  hospital encounter of 06/18/12  POCT URINALYSIS DIP (DEVICE)      Component Value Range   Glucose, UA NEGATIVE  NEGATIVE mg/dL   Bilirubin Urine NEGATIVE  NEGATIVE   Ketones, ur TRACE (*) NEGATIVE mg/dL   Specific Gravity, Urine 1.010  1.005 - 1.030   Hgb urine dipstick MODERATE (*) NEGATIVE   pH 5.0  5.0 - 8.0   Protein, ur NEGATIVE  NEGATIVE mg/dL   Urobilinogen, UA 0.2  0.0 - 1.0 mg/dL   Nitrite NEGATIVE  NEGATIVE   Leukocytes, UA NEGATIVE  NEGATIVE  OCCULT BLOOD, POC DEVICE      Component Value Range   Fecal Occult Bld NEGATIVE      Course in Urgent Care Center:   An indwelling Foley catheter was inserted and had a very large amount of urine was drained out, over 1 L. The patient experienced immediate relief of his symptoms after the urine was drained. The catheter was left in the knee was given a leg bag to use. He was given the name of the urologist on-call to call early Monday morning to try to get in as soon as possible in the meantime should start on the ciprofloxacin. A urine culture was obtained.  Assessment: The primary encounter diagnosis was Urinary retention. A diagnosis of UTI (lower urinary tract infection) was also pertinent to this visit.   Plan:   1.  The following meds were prescribed:   New Prescriptions   CIPROFLOXACIN (CIPRO) 500 MG TABLET    Take 1 tablet (500 mg  total) by mouth every 12 (twelve) hours.   2.  The patient was instructed in symptomatic care and handouts were given. 3.  The patient was told to return if becoming worse in any way, if no better in 3 or 4 days, and given some red flag symptoms that would indicate earlier return.     Reuben Likes, MD 06/19/12 [id number]

## 2012-06-20 LAB — URINE CULTURE
Colony Count: NO GROWTH
Culture: NO GROWTH

## 2012-07-31 ENCOUNTER — Other Ambulatory Visit: Payer: Self-pay | Admitting: Cardiovascular Disease

## 2012-08-23 ENCOUNTER — Ambulatory Visit (INDEPENDENT_AMBULATORY_CARE_PROVIDER_SITE_OTHER): Payer: Medicare Other | Admitting: Cardiovascular Disease

## 2012-08-23 ENCOUNTER — Encounter: Payer: Self-pay | Admitting: Cardiovascular Disease

## 2012-08-23 VITALS — BP 112/73 | HR 58 | Ht 72.0 in | Wt 177.0 lb

## 2012-08-23 DIAGNOSIS — I2510 Atherosclerotic heart disease of native coronary artery without angina pectoris: Secondary | ICD-10-CM

## 2012-08-23 NOTE — Progress Notes (Signed)
History of Present Illness: 71 yo WM with history of CAD, HLD here today for cardiac follow up. He has been followed in the past by Dr. Juanda Chance. In 1999 he had a stent to the right coronary artery. 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 ABI 2011 normal. He has done well since that time.    He is here today for follow up. No recent chest pain, shortness of breath or palpitations. He retired from his own business. He has been taking all of his medications. He has had some dizziness in the heat.   Primary Care Physician: None  Last Lipid Profile:Lipid Panel     Component Value Date/Time   CHOL 155 08/05/2011 0833   TRIG 122.0 08/05/2011 0833   HDL 33.80* 08/05/2011 0833   CHOLHDL 5 08/05/2011 0833   VLDL 24.4 08/05/2011 0833   LDLCALC 97 08/05/2011 0833     Past Medical History  Diagnosis Date  . 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 Hyperlipidemia     with low high-density lipoprotein  . Kidney stone   . S/P coronary artery stent placement     Past Surgical History  Procedure Date  . Cardiac catheterization   . Coronary stent placement     Current Outpatient Prescriptions  Medication Sig Dispense Refill  . aspirin 81 MG tablet Take 81 mg by mouth daily.        Marland Kitchen lisinopril (PRINIVIL,ZESTRIL) 5 MG tablet TAKE ONE TABLET BY MOUTH EVERY DAY  30 tablet  0  . simvastatin (ZOCOR) 80 MG tablet TAKE ONE-HALF TABLET BY MOUTH EVERY DAY  30 tablet  4  . Tamsulosin HCl (FLOMAX) 0.4 MG CAPS Take 0.4 mg by mouth.        Allergies  Allergen Reactions  . Iodine Rash    History   Social History  . Marital Status: Married    Spouse Name: N/A    Number of Children: N/A  . Years of Education: N/A   Occupational History  . Not on file.     Social History Main Topics  . Smoking status: Never Smoker   . Smokeless tobacco: Not on file  . Alcohol Use: Yes  . Drug Use: No  . Sexually Active:    Other Topics Concern  . Not on file   Social History Narrative  . No narrative on file    No family history on file.  Review of Systems:  As stated in the HPI and otherwise negative.   BP 112/73  Pulse 58  Ht 6' (1.829 m)  Wt 177 lb (80.287 kg)  BMI 24.01 kg/m2  Physical Examination: General: Well developed, well nourished, NAD HEENT: OP clear, mucus membranes moist SKIN: warm, dry. No rashes. Neuro: No focal deficits Musculoskeletal: Muscle strength 5/5 all ext Psychiatric: Mood and affect normal Neck: No JVD, no carotid bruits, no thyromegaly, no lymphadenopathy. Lungs:Clear bilaterally, no wheezes, rhonci, crackles Cardiovascular: Regular rate and rhythm. No murmurs, gallops or rubs. Abdomen:Soft. Bowel sounds present. Non-tender.  Extremities: No lower extremity edema. Pulses are 2 + in the bilateral DP/PT.  EKG: sinus brady, rate 59 bpm.   Assessment and Plan:   1. CAD, NATIVE VESSEL: Stable. Continue current medications  including ASA/Plavix and statin. Repeat lipids and LFTs this month. Will get echo to assess LV size and function (last one in 2009) with recent dizziness.   2. HYPERLIPIDEMIA-MIXED: Continue statin. Check Lipids.

## 2012-08-23 NOTE — Patient Instructions (Signed)
Your physician wants you to follow-up in:  12 months. You will receive a reminder letter in the mail two months in advance. If you don't receive a letter, please call our office to schedule the follow-up appointment.  Your physician has requested that you have an echocardiogram. Echocardiography is a painless test that uses sound waves to create images of your heart. It provides your doctor with information about the size and shape of your heart and how well your heart's chambers and valves are working. This procedure takes approximately one hour. There are no restrictions for this procedure.   Your physician recommends that you return for fasting lab work on same day as echo--Lipid and Liver profile

## 2012-08-25 ENCOUNTER — Ambulatory Visit (HOSPITAL_COMMUNITY): Payer: Medicare Other | Attending: Cardiovascular Disease | Admitting: Radiology

## 2012-08-25 ENCOUNTER — Other Ambulatory Visit (INDEPENDENT_AMBULATORY_CARE_PROVIDER_SITE_OTHER): Payer: Medicare Other

## 2012-08-25 DIAGNOSIS — E785 Hyperlipidemia, unspecified: Secondary | ICD-10-CM | POA: Insufficient documentation

## 2012-08-25 DIAGNOSIS — I2510 Atherosclerotic heart disease of native coronary artery without angina pectoris: Secondary | ICD-10-CM

## 2012-08-25 DIAGNOSIS — I379 Nonrheumatic pulmonary valve disorder, unspecified: Secondary | ICD-10-CM | POA: Insufficient documentation

## 2012-08-25 DIAGNOSIS — I059 Rheumatic mitral valve disease, unspecified: Secondary | ICD-10-CM | POA: Insufficient documentation

## 2012-08-25 DIAGNOSIS — I079 Rheumatic tricuspid valve disease, unspecified: Secondary | ICD-10-CM | POA: Insufficient documentation

## 2012-08-25 LAB — HEPATIC FUNCTION PANEL
Alkaline Phosphatase: 41 U/L (ref 39–117)
Bilirubin, Direct: 0.1 mg/dL (ref 0.0–0.3)
Total Bilirubin: 0.8 mg/dL (ref 0.3–1.2)
Total Protein: 6.7 g/dL (ref 6.0–8.3)

## 2012-08-25 LAB — LIPID PANEL
Cholesterol: 166 mg/dL (ref 0–200)
LDL Cholesterol: 104 mg/dL — ABNORMAL HIGH (ref 0–99)

## 2012-08-25 NOTE — Progress Notes (Signed)
Echocardiogram performed.  

## 2012-09-01 ENCOUNTER — Other Ambulatory Visit: Payer: Self-pay | Admitting: Cardiovascular Disease

## 2013-03-12 ENCOUNTER — Other Ambulatory Visit: Payer: Self-pay | Admitting: Cardiovascular Disease

## 2013-04-19 ENCOUNTER — Other Ambulatory Visit: Payer: Self-pay | Admitting: Cardiovascular Disease

## 2013-04-24 ENCOUNTER — Other Ambulatory Visit: Payer: Self-pay | Admitting: Cardiovascular Disease

## 2013-04-25 NOTE — Telephone Encounter (Signed)
simvastatin (ZOCOR) 80 MG tablet  TAKE ONE-HALF TABLET BY MOUTH EVERY DAY   30 tablet   Assessment and Plan:  1. CAD, NATIVE VESSEL: Stable. Continue current medications including ASA/Plavix and statin. Repeat lipids and LFTs this month. Will get echo to assess LV size and function (last one in 2009) with recent dizziness.   2. HYPERLIPIDEMIA-MIXED: Continue statin. Check Lipids.  Encounter-Level Documents:  Electronic signature on 08/23/2012 10:18 AM

## 2013-05-20 ENCOUNTER — Other Ambulatory Visit: Payer: Self-pay | Admitting: Cardiovascular Disease

## 2013-07-31 ENCOUNTER — Encounter: Payer: Self-pay | Admitting: *Deleted

## 2013-08-02 ENCOUNTER — Encounter: Payer: Self-pay | Admitting: Cardiovascular Disease

## 2013-08-02 ENCOUNTER — Ambulatory Visit (INDEPENDENT_AMBULATORY_CARE_PROVIDER_SITE_OTHER): Payer: Medicare Other | Admitting: Cardiovascular Disease

## 2013-08-02 VITALS — BP 132/82 | HR 58 | Ht 72.0 in | Wt 189.0 lb

## 2013-08-02 DIAGNOSIS — I2510 Atherosclerotic heart disease of native coronary artery without angina pectoris: Secondary | ICD-10-CM

## 2013-08-02 DIAGNOSIS — E785 Hyperlipidemia, unspecified: Secondary | ICD-10-CM

## 2013-08-02 MED ORDER — SIMVASTATIN 40 MG PO TABS: 40.0000 mg | ORAL_TABLET | Freq: Every day | ORAL | Status: DC

## 2013-08-02 NOTE — Patient Instructions (Addendum)
Your physician wants you to follow-up in: 12 months.  You will receive a reminder letter in the mail two months in advance. If you don't receive a letter, please call our office to schedule the follow-up appointment.  Your physician has requested that you have an exercise tolerance test.  Can be done with NP or PA. For further information please visit [email or web address] Please also follow instruction sheet, as given. To be done in about 12 weeks with fasting lab work same day   Your physician recommends that you return for fasting lab work in:  12 weeks--same day as stress test.   Your physician has recommended you make the following change in your medication:  Resume Simvastatin 40 mg by mouth daily at bedtime.

## 2013-08-02 NOTE — Progress Notes (Signed)
History of Present Illness: 72 yo WM with history of CAD, HLD here today for cardiac follow up. He has been followed in the past by Dr. Juanda Chance. In 1999 he had a stent to the right coronary artery. 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 ABI 2011 normal. Echo 08/25/12 with normal LVEF, mild MR, grade 2 diastolic dysfunction.   He is here today for follow up. No recent chest pain, shortness of breath or palpitations. He retired from his own business. He has been taking all of his medications except for simvastatin which was stopped when he ran out last month.   Primary Care Physician: None  Last Lipid Profile:Lipid Panel     Component Value Date/Time   CHOL 166 08/25/2012 0836   TRIG 138.0 08/25/2012 0836   HDL 34.00* 08/25/2012 0836   CHOLHDL 5 08/25/2012 0836   VLDL 27.6 08/25/2012 0836   LDLCALC 104* 08/25/2012 0836     Past Medical History  Diagnosis Date  . 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 Hyperlipidemia     with low high-density lipoprotein  . Kidney stone   . S/P coronary artery stent placement     Past Surgical History  Procedure Laterality Date  . Cardiac catheterization    . Coronary stent placement      Current Outpatient Prescriptions  Medication Sig Dispense Refill  . aspirin 81 MG tablet Take 81 mg by mouth daily.        Marland Kitchen lisinopril (PRINIVIL,ZESTRIL) 5 MG tablet TAKE ONE TABLET BY MOUTH ONCE DAILY  30 tablet  0  . simvastatin (ZOCOR) 80 MG tablet TAKE ONE-HALF TABLET BY MOUTH ONCE DAILY  30 tablet  0   No current facility-administered medications for this visit.    Allergies  Allergen Reactions  . Iodine Rash    History   Social History  . Marital Status: Married    Spouse Name: N/A    Number  of Children: N/A  . Years of Education: N/A   Occupational History  . Not on file.   Social History Main Topics  . Smoking status: Never Smoker   . Smokeless tobacco: Not on file  . Alcohol Use: Yes  . Drug Use: No  . Sexual Activity:    Other Topics Concern  . Not on file   Social History Narrative  . No narrative on file    No family history on file.  Review of Systems:  As stated in the HPI and otherwise negative.   BP 132/82  Pulse 58  Ht 6' (1.829 m)  Wt 189 lb (85.73 kg)  BMI 25.63 kg/m2  Physical Examination: General: Well developed, well nourished, NAD HEENT: OP clear, mucus membranes moist SKIN: warm, dry. No rashes. Neuro: No focal deficits Musculoskeletal: Muscle strength 5/5 all ext Psychiatric: Mood and affect normal Neck: No JVD, no carotid bruits, no thyromegaly, no lymphadenopathy. Lungs:Clear bilaterally, no wheezes, rhonci, crackles Cardiovascular: Regular rate and rhythm. No murmurs, gallops or rubs. Abdomen:Soft. Bowel sounds present. Non-tender.  Extremities: No lower extremity edema. Pulses are 2 + in the bilateral DP/PT.  EKG: Sinus brady, rate 58 bpm. PVC.   Echo 08/25/12: Left ventricle: The cavity size was normal. Wall thickness  was increased in a pattern of mild LVH. Systolic function was normal. The estimated ejection fraction was in the range of 60% to 65%. Features are consistent with a pseudonormal left ventricular filling pattern, with concomitant abnormal relaxation and increased filling pressure (grade 2 diastolic dysfunction). - Mitral valve: Mild regurgitation. - Right atrium: The atrium was mildly dilated.  Assessment and Plan:   1. CAD: Stable. Continue current medications including ASA/Plavix and statin. Will arrange POET in 12 weeks to exclude ischemia.   2. HYPERLIPIDEMIA: Restart simvastatin 40 mg po QHS. Repeat lipids and LFTs in 12 weeks.

## 2013-08-06 ENCOUNTER — Other Ambulatory Visit: Payer: Self-pay | Admitting: *Deleted

## 2013-08-06 MED ORDER — LISINOPRIL 5 MG PO TABS: 5.0000 mg | ORAL_TABLET | Freq: Every day | ORAL | Status: DC

## 2013-10-30 ENCOUNTER — Other Ambulatory Visit: Payer: Medicare Other

## 2013-10-30 ENCOUNTER — Encounter: Payer: Medicare Other | Admitting: Physician Assistant

## 2014-07-31 ENCOUNTER — Other Ambulatory Visit: Payer: Self-pay | Admitting: Cardiovascular Disease

## 2014-08-12 ENCOUNTER — Ambulatory Visit: Payer: Medicare Other | Admitting: Cardiovascular Disease

## 2014-08-16 ENCOUNTER — Encounter: Payer: Self-pay | Admitting: Cardiovascular Disease

## 2014-08-16 ENCOUNTER — Ambulatory Visit (INDEPENDENT_AMBULATORY_CARE_PROVIDER_SITE_OTHER): Payer: Medicare Other | Admitting: Cardiovascular Disease

## 2014-08-16 VITALS — BP 118/80 | HR 55 | Ht 71.5 in | Wt 176.0 lb

## 2014-08-16 DIAGNOSIS — E785 Hyperlipidemia, unspecified: Secondary | ICD-10-CM

## 2014-08-16 DIAGNOSIS — I2510 Atherosclerotic heart disease of native coronary artery without angina pectoris: Secondary | ICD-10-CM

## 2014-08-16 LAB — HEPATIC FUNCTION PANEL
ALT: 15 U/L (ref 0–53)
AST: 16 U/L (ref 0–37)
Albumin: 4.2 g/dL (ref 3.5–5.2)
Alkaline Phosphatase: 41 U/L (ref 39–117)
BILIRUBIN DIRECT: 0.1 mg/dL (ref 0.0–0.3)
BILIRUBIN TOTAL: 0.6 mg/dL (ref 0.2–1.2)
Total Protein: 7.7 g/dL (ref 6.0–8.3)

## 2014-08-16 LAB — BASIC METABOLIC PANEL
BUN: 19 mg/dL (ref 6–23)
CALCIUM: 9.3 mg/dL (ref 8.4–10.5)
CO2: 25 mEq/L (ref 19–32)
Chloride: 104 mEq/L (ref 96–112)
Creatinine, Ser: 1.3 mg/dL (ref 0.4–1.5)
GFR: 56.66 mL/min — AB (ref >60.00)
GLUCOSE: 108 mg/dL — AB (ref 70–99)
Potassium: 4.5 mEq/L (ref 3.5–5.1)
Sodium: 138 mEq/L (ref 135–145)

## 2014-08-16 LAB — LIPID PANEL
CHOLESTEROL: 159 mg/dL (ref 0–200)
HDL: 26.9 mg/dL — ABNORMAL LOW (ref >39.00)
LDL Cholesterol: 95 mg/dL (ref 0–99)
NonHDL: 132.1
Total CHOL/HDL Ratio: 6
Triglycerides: 185 mg/dL — ABNORMAL HIGH (ref 0.0–149.0)
VLDL: 37 mg/dL (ref 0.0–40.0)

## 2014-08-16 NOTE — Patient Instructions (Signed)
Your physician wants you to follow-up in:  12 months.  You will receive a reminder letter in the mail two months in advance. If you don't receive a letter, please call our office to schedule the follow-up appointment.   

## 2014-08-16 NOTE — Progress Notes (Signed)
History of Present Illness: 73 yo WM with history of CAD, HLD here today for cardiac follow up. He has been followed in the past by Dr. Olevia Perches. In 1999 he had a stent placed in the RCA. 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 ABI 2011 normal. Echo 08/25/12 with normal LVEF, mild MR, grade 2 diastolic dysfunction. I arranged a stress test September 2014 but he cancelled the test.   He is here today for follow up. No recent chest pain, shortness of breath or palpitations. He does not wish to pursue stress testing.   Primary Care Physician: None  Last Lipid Profile:Lipid Panel     Component Value Date/Time   CHOL 166 08/25/2012 0836   TRIG 138.0 08/25/2012 0836   HDL 34.00* 08/25/2012 0836   CHOLHDL 5 08/25/2012 0836   VLDL 27.6 08/25/2012 0836   LDLCALC 104* 08/25/2012 0836     Past Medical History  Diagnosis Date  . 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 Hyperlipidemia     with low high-density lipoprotein  . Kidney stone   . S/P coronary artery stent placement     Past Surgical History  Procedure Laterality Date  . Cardiac catheterization    . Coronary stent placement      Current Outpatient Prescriptions  Medication Sig Dispense Refill  . aspirin 81 MG tablet Take 81 mg by mouth daily.        Marland Kitchen lisinopril (PRINIVIL,ZESTRIL) 5 MG tablet TAKE ONE TABLET BY MOUTH ONCE DAILY  90 tablet  0  . simvastatin (ZOCOR) 40 MG tablet Take 1 tablet (40 mg total) by mouth at bedtime.  90 tablet  3   No current facility-administered medications for this visit.    Allergies  Allergen Reactions  . Iodine Rash    History   Social History  . Marital Status: Married    Spouse Name: N/A    Number of Children: N/A  . Years of  Education: N/A   Occupational History  . Not on file.   Social History Main Topics  . Smoking status: Never Smoker   . Smokeless tobacco: Not on file  . Alcohol Use: Yes  . Drug Use: No  . Sexual Activity:    Other Topics Concern  . Not on file   Social History Narrative  . No narrative on file    No family history on file.  Review of Systems:  As stated in the HPI and otherwise negative.   BP 118/80  Pulse 55  Ht 5' 11.5" (1.816 m)  Wt 176 lb (79.833 kg)  BMI 24.21 kg/m2  Physical Examination: General: Well developed, well nourished, NAD HEENT: OP clear, mucus membranes moist SKIN: warm, dry. No rashes. Neuro: No focal deficits Musculoskeletal: Muscle strength 5/5 all ext Psychiatric: Mood and affect normal Neck: No JVD, no carotid bruits, no thyromegaly, no lymphadenopathy. Lungs:Clear bilaterally, no wheezes, rhonci, crackles Cardiovascular: Regular rhythm, brady. No murmurs, gallops or rubs. Abdomen:Soft. Bowel sounds present. Non-tender.  Extremities: No lower extremity edema. Pulses are 2 + in the bilateral DP/PT.  Echo 08/25/12: Left ventricle: The cavity size was normal. Wall thickness was increased in a pattern of mild LVH. Systolic function was normal.  The estimated ejection fraction was in the range of 60% to 65%. Features are consistent with a pseudonormal left ventricular filling pattern, with concomitant abnormal relaxation and increased filling pressure (grade 2 diastolic dysfunction). - Mitral valve: Mild regurgitation. - Right atrium: The atrium was mildly dilated.  EKG: sinus brady, rate 55 bpm.   Assessment and Plan:   1. CAD: Stable. Continue current medications including ASA and statin. Check BMET today.   2. HYPERLIPIDEMIA: Continue statin.  Check lipids and LFTs today.

## 2014-10-22 ENCOUNTER — Telehealth: Payer: Self-pay | Admitting: *Deleted

## 2014-10-22 DIAGNOSIS — E785 Hyperlipidemia, unspecified: Secondary | ICD-10-CM

## 2014-10-22 MED ORDER — ATORVASTATIN CALCIUM 40 MG PO TABS: 40.0000 mg | ORAL_TABLET | Freq: Every day | ORAL | Status: DC

## 2014-10-22 NOTE — Telephone Encounter (Signed)
Dr. Angelena Form would like pt to stop Simvastatin and start Atorvastatin 40 mg daily (see lab results dated 08/16/14). I spoke with pt today and he has about 3 weeks left of Simvastatin and then is willing to change to Atorvastatin.  He will come in on January 29, 2015 for fasting labs.  Will send prescription to [place] at [facility].

## 2014-12-04 ENCOUNTER — Other Ambulatory Visit: Payer: Self-pay

## 2014-12-04 MED ORDER — LISINOPRIL 5 MG PO TABS: 5.0000 mg | ORAL_TABLET | Freq: Every day | ORAL | Status: DC

## 2015-01-29 ENCOUNTER — Other Ambulatory Visit (INDEPENDENT_AMBULATORY_CARE_PROVIDER_SITE_OTHER): Payer: Medicare HMO | Admitting: *Deleted

## 2015-01-29 DIAGNOSIS — E785 Hyperlipidemia, unspecified: Secondary | ICD-10-CM

## 2015-01-29 LAB — LIPID PANEL
Cholesterol: 112 mg/dL (ref 0–200)
HDL: 21.9 mg/dL — ABNORMAL LOW (ref >39.00)
LDL Cholesterol: 71 mg/dL (ref 0–99)
NonHDL: 90.1
Total CHOL/HDL Ratio: 5
Triglycerides: 96 mg/dL (ref 0.0–149.0)
VLDL: 19.2 mg/dL (ref 0.0–40.0)

## 2015-01-29 LAB — HEPATIC FUNCTION PANEL
ALBUMIN: 3.9 g/dL (ref 3.5–5.2)
ALT: 16 U/L (ref 0–53)
AST: 17 U/L (ref 0–37)
Alkaline Phosphatase: 48 U/L (ref 39–117)
Bilirubin, Direct: 0.1 mg/dL (ref 0.0–0.3)
Total Bilirubin: 0.4 mg/dL (ref 0.2–1.2)
Total Protein: 7 g/dL (ref 6.0–8.3)

## 2015-02-03 ENCOUNTER — Telehealth: Payer: Self-pay | Admitting: Cardiovascular Disease

## 2015-02-03 DIAGNOSIS — E785 Hyperlipidemia, unspecified: Secondary | ICD-10-CM

## 2015-02-03 NOTE — Telephone Encounter (Signed)
Follow Up        Pt returning Pat's phone call in regards to medication. Please call back and advise.

## 2015-02-03 NOTE — Telephone Encounter (Signed)
Pt calling to inform Dr Camillia Herter nurse Fraser Din that he has switched his medications to [facility] mail service delivery and would like his meds sent through them for a 90 day supply.  Asked pt which [employer] service mail delivery address available to send his meds to, and pt states "I have no idea, but I have a contact number to call and get the address from." Advised the pt to contact [facility] and obtain the address information from them and endorse this to [facility] tomorrow. Informed the pt that when she has the correct address information, she can send his med supply into [facility] with confirmation his meds are going to the right place. Pt verbalized understanding and agrees with this plan, stating "I will call tomorrow."

## 2015-02-06 MED ORDER — ATORVASTATIN CALCIUM 40 MG PO TABS: 40.0000 mg | ORAL_TABLET | Freq: Every day | ORAL | Status: DC

## 2015-02-06 MED ORDER — LISINOPRIL 5 MG PO TABS: 5.0000 mg | ORAL_TABLET | Freq: Every day | ORAL | Status: DC

## 2015-02-06 NOTE — Telephone Encounter (Signed)
Atorvastatin and Lisinopril sent to [facility] mail order. Pt notified.

## 2015-07-31 ENCOUNTER — Other Ambulatory Visit: Payer: Self-pay | Admitting: Ophthalmology

## 2015-07-31 DIAGNOSIS — H4720 Unspecified optic atrophy: Secondary | ICD-10-CM

## 2015-07-31 DIAGNOSIS — H510 Palsy (spasm) of conjugate gaze: Secondary | ICD-10-CM

## 2015-08-14 ENCOUNTER — Ambulatory Visit
Admission: RE | Admit: 2015-08-14 | Discharge: 2015-08-14 | Disposition: A | Discharge status: Home / Self Care | Payer: Medicare HMO | Source: Ambulatory Visit | Attending: Ophthalmology | Admitting: Ophthalmology

## 2015-08-14 DIAGNOSIS — H510 Palsy (spasm) of conjugate gaze: Secondary | ICD-10-CM

## 2015-08-14 DIAGNOSIS — H4720 Unspecified optic atrophy: Secondary | ICD-10-CM

## 2015-08-14 MED ORDER — GADOBENATE DIMEGLUMINE 529 MG/ML IV SOLN
17.0000 mL | Freq: Once | INTRAVENOUS | Status: AC | PRN
  Administered 2015-08-14: 17 mL via INTRAVENOUS

## 2015-09-03 ENCOUNTER — Other Ambulatory Visit: Payer: Medicare HMO

## 2015-09-24 ENCOUNTER — Ambulatory Visit: Payer: Medicare HMO | Admitting: Cardiovascular Disease

## 2016-08-01 ENCOUNTER — Ambulatory Visit (HOSPITAL_COMMUNITY)
Admission: EM | Admit: 2016-08-01 | Discharge: 2016-08-01 | Disposition: A | Discharge status: Home / Self Care | Payer: Medicare HMO | Attending: Family Medicine | Admitting: Family Medicine

## 2016-08-01 ENCOUNTER — Encounter (HOSPITAL_COMMUNITY): Payer: Self-pay | Admitting: *Deleted

## 2016-08-01 DIAGNOSIS — R338 Other retention of urine: Secondary | ICD-10-CM | POA: Diagnosis not present

## 2016-08-01 LAB — POCT I-STAT, CHEM 8
BUN: 17 mg/dL (ref 6–20)
CALCIUM ION: 1.21 mmol/L (ref 1.15–1.40)
CHLORIDE: 102 mmol/L (ref 101–111)
Creatinine, Ser: 1 mg/dL (ref 0.61–1.24)
Glucose, Bld: 137 mg/dL — ABNORMAL HIGH (ref 65–99)
HEMATOCRIT: 48 % (ref 39.0–52.0)
Hemoglobin: 16.3 g/dL (ref 13.0–17.0)
Potassium: 4.5 mmol/L (ref 3.5–5.1)
SODIUM: 140 mmol/L (ref 135–145)
TCO2: 24 mmol/L (ref 0–100)

## 2016-08-01 LAB — POCT URINALYSIS DIP (DEVICE)
BILIRUBIN URINE: NEGATIVE
GLUCOSE, UA: NEGATIVE mg/dL
Ketones, ur: NEGATIVE mg/dL
Leukocytes, UA: NEGATIVE
NITRITE: NEGATIVE
PH: 5.5 (ref 5.0–8.0)
PROTEIN: NEGATIVE mg/dL
Specific Gravity, Urine: <=1.005 (ref 1.005–1.030)
Urobilinogen, UA: 0.2 mg/dL (ref 0.0–1.0)

## 2016-08-01 MED ORDER — TAMSULOSIN HCL 0.4 MG PO CAPS: 0.4000 mg | ORAL_CAPSULE | Freq: Every day | ORAL | 1 refills | Status: DC

## 2016-08-01 NOTE — ED Notes (Signed)
20  Foley  Inserted  Without  Difficulty     1500  Cc  Returned       Dr Juventino Slovak  Present    Foley  attatched  To leg  Bag  And  Pt  Instructed  As  To use

## 2016-08-01 NOTE — Discharge Instructions (Signed)
Call uroiogy office on tues for recheck next week for recheck. Sooner if any problems.

## 2016-08-01 NOTE — ED Triage Notes (Signed)
Pt unable  To urinate    Only  Dribbled  Some  Last   Bladder  Is  Distended   And  He  Is  Unable  To  Urinate   He  Is  Very  Uncomfortable

## 2016-08-01 NOTE — ED Provider Notes (Signed)
[place]    CSN: [id number] Arrival date & time: 08/01/16  1304  First Provider Contact:  First MD Initiated Contact with Patient 08/01/16 1348        History   Chief Complaint Chief Complaint  Patient presents with  . Urinary Retention    HPI PAPE YAHR is a 75 y.o. male.    Abdominal Pain  Pain location:  Suprapubic Pain quality: pressure   Pain radiates to:  Does not radiate Onset quality:  Sudden Duration:  12 hours Progression:  Worsening (sudden inability to urinate since last eve,) Chronicity:  Recurrent Associated symptoms: no dysuria, no fever and no hematuria   Risk factors: being elderly     Past Medical History:  Diagnosis Date  . 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 Hyperlipidemia    with low high-density lipoprotein  . Kidney stone   . S/P coronary artery stent placement     Patient Active Problem List   Diagnosis Date Noted  . HYPERLIPIDEMIA-MIXED 07/22/2009  . CAD, NATIVE VESSEL 07/22/2009    Past Surgical History:  Procedure Laterality Date  . CARDIAC CATHETERIZATION    . CORONARY STENT PLACEMENT      OB History    No data available       Home Medications    Prior to Admission medications   Medication Sig Start Date End Date Taking? Authorizing Provider  aspirin 81 MG tablet Take 81 mg by mouth daily.      Historical Provider, MD  atorvastatin (LIPITOR) 40 MG tablet Take 1 tablet (40 mg total) by mouth daily. 02/06/15   Burnell Blanks, MD  lisinopril (PRINIVIL,ZESTRIL) 5 MG tablet Take 1 tablet (5 mg total) by mouth daily. 02/06/15   Burnell Blanks, MD    Family History No family history on file.  Social History Social History  Substance Use Topics  . Smoking status: Never Smoker  . Smokeless tobacco: Not on file  .  Alcohol use Yes     Allergies   Gadolinium derivatives; Multihance [gadobenate]; and Iodine   Review of Systems Review of Systems  Constitutional: Negative for fever.  Gastrointestinal: Positive for abdominal pain.  Genitourinary: Positive for difficulty urinating. Negative for dysuria, flank pain, hematuria and testicular pain.     Physical Exam Triage Vital Signs ED Triage Vitals  Enc Vitals Group     BP      Pulse      Resp      Temp      Temp src      SpO2      Weight      Height      Head Circumference      Peak Flow      Pain Score      Pain Loc      Pain Edu?      Excl. in [place]?    No data found.   Updated Vital Signs There were no vitals taken for this visit.  Visual Acuity Right Eye Distance:   Left Eye Distance:   Bilateral Distance:    Right Eye Near:   Left Eye Near:    Bilateral Near:     Physical Exam  Constitutional: He appears well-developed and well-nourished. He appears distressed.  Abdominal: Soft. Bowel sounds are  normal. He exhibits distension. There is tenderness.  Genitourinary: Penis normal. Prostate is enlarged and tender. Circumcised.  Nursing note and vitals reviewed.    UC Treatments / Results  Labs (all labs ordered are listed, but only abnormal results are displayed) Labs Reviewed  POCT URINALYSIS DIP (DEVICE) - Abnormal; Notable for the following:       Result Value   Hgb urine dipstick MODERATE (*)    All other components within normal limits   I-stat wnl   EKG  EKG Interpretation None       Radiology No results found.  Procedures Procedures (including critical care time)  Medications Ordered in UC Medications - No data to display   Initial Impression / Assessment and Plan / UC Course  I have reviewed the triage vital signs and the nursing notes.  Pertinent labs & imaging results that were available during my care of the patient were reviewed by me and considered in my medical decision making (see  chart for details).  Clinical Course   Discussed with dr Junious Silk, plans as noted   Final Clinical Impressions(s) / UC Diagnoses   Final diagnoses:  None    New Prescriptions New Prescriptions   No medications on file     Billy Fischer, MD 08/01/16 1425

## 2017-11-04 ENCOUNTER — Encounter: Payer: Self-pay | Admitting: Cardiovascular Disease

## 2017-11-04 ENCOUNTER — Encounter (INDEPENDENT_AMBULATORY_CARE_PROVIDER_SITE_OTHER): Payer: Self-pay

## 2017-11-04 ENCOUNTER — Ambulatory Visit: Payer: Medicare HMO | Admitting: Cardiovascular Disease

## 2017-11-04 VITALS — BP 128/80 | HR 49 | Ht 71.5 in | Wt 180.4 lb

## 2017-11-04 DIAGNOSIS — I2510 Atherosclerotic heart disease of native coronary artery without angina pectoris: Secondary | ICD-10-CM | POA: Diagnosis not present

## 2017-11-04 DIAGNOSIS — E7800 Pure hypercholesterolemia, unspecified: Secondary | ICD-10-CM | POA: Diagnosis not present

## 2017-11-04 NOTE — Patient Instructions (Signed)

## 2017-11-04 NOTE — Progress Notes (Signed)
Chief Complaint  Patient presents with  . Coronary Artery Disease   History of Present Illness: 76 yo male with history of CAD and HLD here today for cardiac follow up. In 1999 he had a stent placed in the RCA. 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 Echo 08/25/12 with normal LVEF, mild MR, grade 2 diastolic dysfunction. I arranged a stress test September 2014 but he cancelled the test. He has not been seen in our office since 2015.   He is here today for follow up. The patient denies any chest pain, dyspnea, palpitations, lower extremity edema, orthopnea, PND, dizziness, near syncope or syncope.   Primary Care Physician: Cloward, Leodis [place], [place])  Past Medical History:  Diagnosis Date  . 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 Hyperlipidemia    with low high-density lipoprotein  . Kidney stone     Past Surgical History:  Procedure Laterality Date  . BACK SURGERY    . CHOLECYSTECTOMY    . CORONARY STENT PLACEMENT      Current Outpatient Medications  Medication Sig Dispense Refill  . aspirin 81 MG tablet Take 81 mg by mouth daily.      Marland Kitchen atorvastatin (LIPITOR) 40 MG tablet Take 1 tablet (40 mg total) by mouth daily. 90 tablet 3  . lisinopril (PRINIVIL,ZESTRIL) 5 MG tablet Take 1 tablet (5 mg total) by mouth daily. 90 tablet 1  . tamsulosin (FLOMAX) 0.4 MG CAPS capsule Take 1 capsule (0.4 mg total) by mouth daily after supper. 30 capsule 1  . dorzolamide-timolol (COSOPT) 22.3-6.8 MG/ML ophthalmic solution Place 1 drop into both eyes daily.    Marland Kitchen LUMIGAN 0.01 % SOLN Place 1 drop into both eyes daily.     No current facility-administered medications for this visit.     Allergies  Allergen Reactions  . Gadolinium  Derivatives Hives  . Multihance [Gadobenate] Hives    [name]  . Iodine Rash    Social History   Socioeconomic History  . Marital status: Married    Spouse name: Not on file  . Number of children: Not on file  . Years of education: Not on file  . Highest education level: Not on file  Social Needs  . Financial resource strain: Not on file  . Food insecurity - worry: Not on file  . Food insecurity - inability: Not on file  . Transportation needs - medical: Not on file  . Transportation needs - non-medical: Not on file  Occupational History  . Not on file  Tobacco Use  . Smoking status: Never Smoker  . Smokeless tobacco: Never Used  Substance and Sexual Activity  . Alcohol use: Yes  . Drug use: No  . Sexual activity: Not on file  Other Topics Concern  . Not on file  Social History Narrative  . Not on file    History reviewed. No pertinent family history.  Review of Systems:  As stated in the HPI and otherwise negative.   BP 128/80   Pulse (!) 49   Ht 5' 11.5" (1.816 m)   Wt 180 lb 6.4 oz (81.8 kg)   SpO2 98%   BMI 24.81 kg/m   Physical  Examination:  General: Well developed, well nourished, NAD  HEENT: OP clear, mucus membranes moist  SKIN: warm, dry. No rashes. Neuro: No focal deficits  Musculoskeletal: Muscle strength 5/5 all ext  Psychiatric: Mood and affect normal  Neck: No JVD, no carotid bruits, no thyromegaly, no lymphadenopathy.  Lungs:Clear bilaterally, no wheezes, rhonci, crackles Cardiovascular: Regular rate and rhythm. No murmurs, gallops or rubs. Abdomen:Soft. Bowel sounds present. Non-tender.  Extremities: No lower extremity edema. Pulses are 2 + in the bilateral DP/PT.  Echo 08/25/12: Left ventricle: The cavity size was normal. Wall thickness was increased in a pattern of mild LVH. Systolic function was normal. The estimated ejection fraction was in the range of 60% to 65%. Features are consistent with  a pseudonormal left ventricular filling pattern, with concomitant abnormal relaxation and increased filling pressure (grade 2 diastolic dysfunction). - Mitral valve: Mild regurgitation. - Right atrium: The atrium was mildly dilated.  EKG:  EKG is ordered today. The ekg ordered today demonstrates sinus brady, rate 49 bpm.   Recent Labs: No results found for requested labs within last 8760 hours.   Lipid Panel    Component Value Date/Time   CHOL 112 01/29/2015 0735   TRIG 96.0 01/29/2015 0735   HDL 21.90 (L) 01/29/2015 0735   CHOLHDL 5 01/29/2015 0735   VLDL 19.2 01/29/2015 0735   LDLCALC 71 01/29/2015 0735     Wt Readings from Last 3 Encounters:  11/04/17 180 lb 6.4 oz (81.8 kg)  08/16/14 176 lb (79.8 kg)  08/02/13 189 lb (85.7 kg)     Other studies Reviewed: Additional studies/ records that were reviewed today include:  Review of the above records demonstrates:   Assessment and Plan:   1. CAD without angina: He is having no chest pain suggestive of angina. Will continue ASA and statin.   2. HYPERLIPIDEMIA: Check lipids and LFTS today. Continue statin.    Current medicines are reviewed at length with the patient today.  The patient does not have concerns regarding medicines.  The following changes have been made:  no change  Labs/ tests ordered today include:   Orders Placed This Encounter  Procedures  . EKG 12-Lead   Disposition:   FU with me in 12 months   Signed, Lauree Chandler, MD 11/04/2017 9:22 AM    [facility] [address], [place], [place]  [address] Phone: [phone]; Fax: [phone]

## 2018-10-09 ENCOUNTER — Encounter: Payer: Self-pay | Admitting: Cardiovascular Disease

## 2018-10-30 ENCOUNTER — Encounter: Payer: Self-pay | Admitting: Cardiovascular Disease

## 2018-10-30 ENCOUNTER — Ambulatory Visit: Payer: Medicare HMO | Admitting: Cardiovascular Disease

## 2018-10-30 VITALS — BP 136/72 | HR 49 | Ht 71.5 in | Wt 177.6 lb

## 2018-10-30 DIAGNOSIS — I2510 Atherosclerotic heart disease of native coronary artery without angina pectoris: Secondary | ICD-10-CM

## 2018-10-30 DIAGNOSIS — E7800 Pure hypercholesterolemia, unspecified: Secondary | ICD-10-CM

## 2018-10-30 NOTE — Patient Instructions (Signed)

## 2018-10-30 NOTE — Progress Notes (Signed)
Chief Complaint  Patient presents with  . Follow-up    CAD   History of Present Illness: 77 yo male with history of CAD and HLD here today for cardiac follow up. In 1999 he had a stent placed in the RCA. 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 Echo 08/25/12 with normal LVEF, mild MR, grade 2 diastolic dysfunction. He was seen in my office in 2018 for annual follow up and was doing well  He is here today for follow up. The patient denies any chest pain, dyspnea, palpitations, lower extremity edema, orthopnea, PND, dizziness, near syncope or syncope.   Primary Care Physician: Geraldo Pitter, MD Azell Der, MD   Past Medical History:  Diagnosis Date  . 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 Hyperlipidemia    with low high-density lipoprotein  . Kidney stone     Past Surgical History:  Procedure Laterality Date  . BACK SURGERY    . CHOLECYSTECTOMY    . CORONARY STENT PLACEMENT      Current Outpatient Medications  Medication Sig Dispense Refill  . aspirin 81 MG tablet Take 81 mg by mouth daily.      Marland Kitchen atorvastatin (LIPITOR) 40 MG tablet Take 1 tablet (40 mg total) by mouth daily. 90 tablet 3  . dorzolamide-timolol (COSOPT) 22.3-6.8 MG/ML ophthalmic solution Place 1 drop into both eyes daily.    Marland Kitchen lisinopril (PRINIVIL,ZESTRIL) 5 MG tablet Take 1 tablet (5 mg total) by mouth daily. 90 tablet 1  . LORazepam (ATIVAN) 0.5 MG tablet Take 1 tablet by mouth as needed.    Marland Kitchen LUMIGAN 0.01 % SOLN Place 1 drop into both eyes daily.     No current facility-administered medications for this visit.     Allergies  Allergen Reactions  . Gadolinium Derivatives Hives  . Multihance [Gadobenate] Hives    [name]  . Iodine Rash      Social History   Socioeconomic History  . Marital status: Married    Spouse name: Not on file  . Number of children: Not on file  . Years of education: Not on file  . Highest education level: Not on file  Occupational History  . Not on file  Social Needs  . Financial resource strain: Not on file  . Food insecurity:    Worry: Not on file    Inability: Not on file  . Transportation needs:    Medical: Not on file    Non-medical: Not on file  Tobacco Use  . Smoking status: Never Smoker  . Smokeless tobacco: Never Used  Substance and Sexual Activity  . Alcohol use: Yes  . Drug use: No  . Sexual activity: Not on file  Lifestyle  . Physical activity:    Days per week: Not on file    Minutes per session: Not on file  . Stress: Not on file  Relationships  . Social connections:    Talks on phone: Not on file    Gets together: Not on file    Attends religious service: Not on file    Active member of club or organization: Not on file    Attends meetings of clubs or organizations: Not on  file    Relationship status: Not on file  . Intimate partner violence:    Fear of current or ex partner: Not on file    Emotionally abused: Not on file    Physically abused: Not on file    Forced sexual activity: Not on file  Other Topics Concern  . Not on file  Social History Narrative  . Not on file    History reviewed. No pertinent family history.  Review of Systems:  As stated in the HPI and otherwise negative.   BP 136/72   Pulse (!) 49   Ht 5' 11.5" (1.816 m)   Wt 177 lb 9.6 oz (80.6 kg)   SpO2 99%   BMI 24.42 kg/m   Physical Examination:  General: Well developed, well nourished, NAD  HEENT: OP clear, mucus membranes moist  SKIN: warm, dry. No rashes. Neuro: No focal deficits  Musculoskeletal: Muscle strength 5/5 all ext  Psychiatric: Mood and affect normal  Neck: No JVD, no carotid bruits, no thyromegaly, no lymphadenopathy.  Lungs:Clear bilaterally, no wheezes,  rhonci, crackles Cardiovascular: Regular rate and rhythm. No murmurs, gallops or rubs. Abdomen:Soft. Bowel sounds present. Non-tender.  Extremities: No lower extremity edema. Pulses are 2 + in the bilateral DP/PT.  Echo 08/25/12: Left ventricle: The cavity size was normal. Wall thickness was increased in a pattern of mild LVH. Systolic function was normal. The estimated ejection fraction was in the range of 60% to 65%. Features are consistent with a pseudonormal left ventricular filling pattern, with concomitant abnormal relaxation and increased filling pressure (grade 2 diastolic dysfunction). - Mitral valve: Mild regurgitation. - Right atrium: The atrium was mildly dilated.  EKG:  EKG is ordered today. The ekg ordered today demonstrates Sinus bradycardia, rate 49 bpm  Recent Labs: No results found for requested labs within last 8760 hours.   Lipid Panel    Component Value Date/Time   CHOL 112 01/29/2015 0735   TRIG 96.0 01/29/2015 0735   HDL 21.90 (L) 01/29/2015 0735   CHOLHDL 5 01/29/2015 0735   VLDL 19.2 01/29/2015 0735   LDLCALC 71 01/29/2015 0735     Wt Readings from Last 3 Encounters:  10/30/18 177 lb 9.6 oz (80.6 kg)  11/04/17 180 lb 6.4 oz (81.8 kg)  08/16/14 176 lb (79.8 kg)     Other studies Reviewed: Additional studies/ records that were reviewed today include:  Review of the above records demonstrates:   Assessment and Plan:   1. CAD without angina: No chest pain. Continue ASA and statin.  No beta blocker due to bradycardia.   2. HYPERLIPIDEMIA: Lipids followed in primary care. LDL was 71 in November 2019. Continue statin  Current medicines are reviewed at length with the patient today.  The patient does not have concerns regarding medicines.  The following changes have been made:  no change  Labs/ tests ordered today include:   Orders Placed This Encounter  Procedures  . EKG 12-Lead   Disposition:   FU with me in 12  months   Signed, Lauree Chandler, MD 10/30/2018 8:54 AM    [facility] [place], [place], [place]  [address] Phone: [phone]; Fax: [phone]

## 2020-01-24 ENCOUNTER — Other Ambulatory Visit: Payer: Self-pay

## 2020-01-24 ENCOUNTER — Ambulatory Visit: Payer: Medicare HMO | Admitting: Cardiovascular Disease

## 2020-01-24 ENCOUNTER — Encounter: Payer: Self-pay | Admitting: Cardiovascular Disease

## 2020-01-24 VITALS — BP 118/70 | HR 60 | Wt 175.5 lb

## 2020-01-24 DIAGNOSIS — E7800 Pure hypercholesterolemia, unspecified: Secondary | ICD-10-CM

## 2020-01-24 DIAGNOSIS — I2510 Atherosclerotic heart disease of native coronary artery without angina pectoris: Secondary | ICD-10-CM

## 2020-01-24 DIAGNOSIS — I340 Nonrheumatic mitral (valve) insufficiency: Secondary | ICD-10-CM | POA: Diagnosis not present

## 2020-01-24 NOTE — Patient Instructions (Signed)
Medication Instructions:  No changes *If you need a refill on your cardiac medications before your next appointment, please call your pharmacy*  Lab Work: none If you have labs (blood work) drawn today and your tests are completely normal, you will receive your results only by: . MyChart Message (if you have MyChart) OR . A paper copy in the mail If you have any lab test that is abnormal or we need to change your treatment, we will call you to review the results.  Testing/Procedures: Your physician has requested that you have an echocardiogram. Echocardiography is a painless test that uses sound waves to create images of your heart. It provides your doctor with information about the size and shape of your heart and how well your heart's chambers and valves are working. This procedure takes approximately one hour. There are no restrictions for this procedure.  Follow-Up: At [facility], you and your health needs are our priority.  As part of our continuing mission to provide you with exceptional heart care, we have created designated Provider Care Teams.  These Care Teams include your primary Cardiologist (physician) and Advanced Practice Providers (APPs -  Physician Assistants and Nurse Practitioners) who all work together to provide you with the care you need, when you need it.  Your next appointment:   12 month(s)  The format for your next appointment:   Either In Person or Virtual  Provider:   Christopher McAlhany, MD  Other Instructions   

## 2020-01-24 NOTE — Progress Notes (Signed)
Chief Complaint  Patient presents with  . Follow-up    CAD   History of Present Illness: 79 yo male with history of CAD and HLD here today for cardiac follow up. In 1999 he had a stent placed in the RCA. 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 Echo 08/25/12 with normal LVEF, mild MR, grade 2 diastolic dysfunction.   He is here today for follow up. The patient denies any chest pain, dyspnea, palpitations, lower extremity edema, orthopnea, PND, dizziness, near syncope or syncope.   Primary Care Physician: Geraldo Pitter, MD Azell Der, MD   Past Medical History:  Diagnosis Date  . 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 Hyperlipidemia    with low high-density lipoprotein  . Kidney stone     Past Surgical History:  Procedure Laterality Date  . BACK SURGERY    . CHOLECYSTECTOMY    . CORONARY STENT PLACEMENT      Current Outpatient Medications  Medication Sig Dispense Refill  . aspirin 81 MG tablet Take 81 mg by mouth daily.      Marland Kitchen atorvastatin (LIPITOR) 40 MG tablet Take 1 tablet (40 mg total) by mouth daily. 90 tablet 3  . dorzolamide-timolol (COSOPT) 22.3-6.8 MG/ML ophthalmic solution Place 1 drop into both eyes daily.    Marland Kitchen lisinopril (PRINIVIL,ZESTRIL) 5 MG tablet Take 1 tablet (5 mg total) by mouth daily. 90 tablet 1  . LORazepam (ATIVAN) 0.5 MG tablet Take 1 tablet by mouth as needed.    Marland Kitchen LUMIGAN 0.01 % SOLN Place 1 drop into both eyes daily.     No current facility-administered medications for this visit.    Allergies  Allergen Reactions  . Gadolinium Derivatives Hives  . Multihance [Gadobenate] Hives    [name]  . Iodine Rash    Social History   Socioeconomic History  . Marital status: Married     Spouse name: Not on file  . Number of children: Not on file  . Years of education: Not on file  . Highest education level: Not on file  Occupational History  . Not on file  Tobacco Use  . Smoking status: Never Smoker  . Smokeless tobacco: Never Used  Substance and Sexual Activity  . Alcohol use: Yes  . Drug use: No  . Sexual activity: Not on file  Other Topics Concern  . Not on file  Social History Narrative  . Not on file   Social Determinants of Health   Financial Resource Strain:   . Difficulty of Paying Living Expenses: Not on file  Food Insecurity:   . Worried About Charity fundraiser in the Last Year: Not on file  . Ran Out of Food in the Last Year: Not on file  Transportation Needs:   . Lack of Transportation (Medical): Not on file  . Lack of Transportation (Non-Medical): Not on file  Physical Activity:   . Days of Exercise per Week: Not on file  . Minutes of Exercise per Session: Not on file  Stress:   . Feeling of Stress : Not on file  Social Connections:   . Frequency of Communication with Friends and Family: Not on file  . Frequency  of Social Gatherings with Friends and Family: Not on file  . Attends Religious Services: Not on file  . Active Member of Clubs or Organizations: Not on file  . Attends Archivist Meetings: Not on file  . Marital Status: Not on file  Intimate Partner Violence:   . Fear of Current or Ex-Partner: Not on file  . Emotionally Abused: Not on file  . Physically Abused: Not on file  . Sexually Abused: Not on file    History reviewed. No pertinent family history.  Review of Systems:  As stated in the HPI and otherwise negative.   BP 118/70   Pulse 60   Wt 175 lb 8 oz (79.6 kg)   SpO2 99%   BMI 24.14 kg/m   Physical Examination:  General: Well developed, well nourished, NAD  HEENT: OP clear, mucus membranes moist  SKIN: warm, dry. No rashes. Neuro: No focal deficits  Musculoskeletal: Muscle strength 5/5 all  ext  Psychiatric: Mood and affect normal  Neck: No JVD, no carotid bruits, no thyromegaly, no lymphadenopathy.  Lungs:Clear bilaterally, no wheezes, rhonci, crackles Cardiovascular: Regular rate and rhythm. No murmurs, gallops or rubs. Abdomen:Soft. Bowel sounds present. Non-tender.  Extremities: No lower extremity edema. Pulses are 2 + in the bilateral DP/PT.  Echo 08/25/12: Left ventricle: The cavity size was normal. Wall thickness was increased in a pattern of mild LVH. Systolic function was normal. The estimated ejection fraction was in the range of 60% to 65%. Features are consistent with a pseudonormal left ventricular filling pattern, with concomitant abnormal relaxation and increased filling pressure (grade 2 diastolic dysfunction). - Mitral valve: Mild regurgitation. - Right atrium: The atrium was mildly dilated.  EKG:  EKG is ordered today. The ekg ordered today demonstrates NSR, PACs  Recent Labs: No results found for requested labs within last 8760 hours.   Lipid Panel    Component Value Date/Time   CHOL 112 01/29/2015 0735   TRIG 96.0 01/29/2015 0735   HDL 21.90 (L) 01/29/2015 0735   CHOLHDL 5 01/29/2015 0735   VLDL 19.2 01/29/2015 0735   LDLCALC 71 01/29/2015 0735     Wt Readings from Last 3 Encounters:  01/24/20 175 lb 8 oz (79.6 kg)  10/30/18 177 lb 9.6 oz (80.6 kg)  11/04/17 180 lb 6.4 oz (81.8 kg)     Other studies Reviewed: Additional studies/ records that were reviewed today include:  Review of the above records demonstrates:   Assessment and Plan:   1. CAD without angina: He has no chest pain. He has not been on a beta blocker due to bradycardia. Continue ASA and statin.   2. HYPERLIPIDEMIA: Lipids followed in primary care. LDL 74 in May 2020. Continue statin.   3. Mitral regurgitation: Mild by echo in 2013. Will repeat echo now.   Current medicines are reviewed at length with the patient today.  The patient does not have concerns regarding  medicines.  The following changes have been made:  no change  Labs/ tests ordered today include:   Orders Placed This Encounter  Procedures  . EKG 12-Lead  . ECHOCARDIOGRAM COMPLETE   Disposition:   FU with me in 12 months   Signed, Lauree Chandler, MD 01/24/2020 1:06 PM    [facility] [place], [place], [place]  [address] Phone: [phone]; Fax: [phone]

## 2020-02-07 ENCOUNTER — Other Ambulatory Visit: Payer: Self-pay

## 2020-02-07 ENCOUNTER — Ambulatory Visit (HOSPITAL_COMMUNITY): Payer: Medicare HMO | Attending: Internal Medicine

## 2020-02-07 DIAGNOSIS — I340 Nonrheumatic mitral (valve) insufficiency: Secondary | ICD-10-CM | POA: Diagnosis not present

## 2020-05-24 ENCOUNTER — Other Ambulatory Visit: Payer: Self-pay

## 2020-05-24 ENCOUNTER — Emergency Department (HOSPITAL_COMMUNITY)
Admission: EM | Admit: 2020-05-24 | Discharge: 2020-05-24 | Disposition: A | Discharge status: Home / Self Care | Payer: Medicare HMO | Attending: Emergency Medicine | Admitting: Emergency Medicine

## 2020-05-24 DIAGNOSIS — I2510 Atherosclerotic heart disease of native coronary artery without angina pectoris: Secondary | ICD-10-CM | POA: Diagnosis not present

## 2020-05-24 DIAGNOSIS — Z7982 Long term (current) use of aspirin: Secondary | ICD-10-CM | POA: Diagnosis not present

## 2020-05-24 DIAGNOSIS — R339 Retention of urine, unspecified: Secondary | ICD-10-CM | POA: Insufficient documentation

## 2020-05-24 DIAGNOSIS — Z79899 Other long term (current) drug therapy: Secondary | ICD-10-CM | POA: Insufficient documentation

## 2020-05-24 LAB — BASIC METABOLIC PANEL
Anion gap: 9 (ref 5–15)
BUN: 15 mg/dL (ref 8–23)
CO2: 23 mmol/L (ref 22–32)
Calcium: 8.8 mg/dL — ABNORMAL LOW (ref 8.9–10.3)
Chloride: 106 mmol/L (ref 98–111)
Creatinine, Ser: 1.21 mg/dL (ref 0.61–1.24)
GFR calc Af Amer: >60 mL/min (ref >60)
GFR calc non Af Amer: 56 mL/min — ABNORMAL LOW (ref >60)
Glucose, Bld: 109 mg/dL — ABNORMAL HIGH (ref 70–99)
Potassium: 4.3 mmol/L (ref 3.5–5.1)
Sodium: 138 mmol/L (ref 135–145)

## 2020-05-24 LAB — URINALYSIS, ROUTINE W REFLEX MICROSCOPIC
Bilirubin Urine: NEGATIVE
Glucose, UA: NEGATIVE mg/dL
Ketones, ur: NEGATIVE mg/dL
Leukocytes,Ua: NEGATIVE
Nitrite: NEGATIVE
Protein, ur: NEGATIVE mg/dL
Specific Gravity, Urine: 1.002 — ABNORMAL LOW (ref 1.005–1.030)
pH: 5 (ref 5.0–8.0)

## 2020-05-24 LAB — CBC WITH DIFFERENTIAL/PLATELET
Abs Immature Granulocytes: 0.04 10*3/uL (ref 0.00–0.07)
Basophils Absolute: 0 10*3/uL (ref 0.0–0.1)
Basophils Relative: 0 %
Eosinophils Absolute: 0.1 10*3/uL (ref 0.0–0.5)
Eosinophils Relative: 1 %
HCT: 43.6 % (ref 39.0–52.0)
Hemoglobin: 14.7 g/dL (ref 13.0–17.0)
Immature Granulocytes: 0 %
Lymphocytes Relative: 9 %
Lymphs Abs: 0.8 10*3/uL (ref 0.7–4.0)
MCH: 31.9 pg (ref 26.0–34.0)
MCHC: 33.7 g/dL (ref 30.0–36.0)
MCV: 94.6 fL (ref 80.0–100.0)
Monocytes Absolute: 0.6 10*3/uL (ref 0.1–1.0)
Monocytes Relative: 7 %
Neutro Abs: 7.5 10*3/uL (ref 1.7–7.7)
Neutrophils Relative %: 83 %
Platelets: 160 10*3/uL (ref 150–400)
RBC: 4.61 MIL/uL (ref 4.22–5.81)
RDW: 12.7 % (ref 11.5–15.5)
WBC: 9 10*3/uL (ref 4.0–10.5)
nRBC: 0 % (ref 0.0–0.2)

## 2020-05-24 NOTE — ED Triage Notes (Signed)
Pt presents to ED POV. Pt reports unable to void since "dark" tonight. Pt reports thishas occurred before and 2L was emptied from his bladder.

## 2020-05-24 NOTE — ED Notes (Signed)
Post foley bladder scan 52mL

## 2020-05-24 NOTE — Discharge Instructions (Addendum)
You are seen in the ER for difficulty in voiding. We had to put a Foley catheter to relieve your bladder.  We are discharging you with the Foley catheter in place as we are unsure why you are having urinary retention.  Please call the urologist for an appointment in 5 to 7 days for next steps in management.

## 2020-05-24 NOTE — ED Notes (Signed)
Pt verbalized understanding of d/c instructions, follow up care and s/s requiring return to ed. Pt had no further questions and refused wheelchair. Pt ambulated to exit.

## 2020-05-24 NOTE — ED Notes (Signed)
Bladder scan >679

## 2020-05-24 NOTE — ED Provider Notes (Addendum)
[facility] EMERGENCY DEPARTMENT Provider Note   CSN: [id number] Arrival date & time: 05/24/20  0158     History Chief Complaint  Patient presents with  . Dysuria    FRANCIS DOENGES is a 79 y.o. male.  HPI    79 year old male comes in a chief complaint of burning with urination.  He has history of CAD, hyperlipidemia and kidney stones.  Patient reports that he has not been peeing well for the last couple of days.  Today he hardly has urinated and is having some abdominal pain and distention.  He denies nausea, vomiting, fevers, chills.  He reports that he was " kind of" having burning with urination.  He has had history of bladder infection that led to urinary retention in the past.  Patient denies any rectal pain or known history of prostate issues.  Past Medical History:  Diagnosis Date  . 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 Hyperlipidemia    with low high-density lipoprotein  . Kidney stone     Patient Active Problem List   Diagnosis Date Noted  . HYPERLIPIDEMIA-MIXED 07/22/2009  . CAD, NATIVE VESSEL 07/22/2009    Past Surgical History:  Procedure Laterality Date  . BACK SURGERY    . CHOLECYSTECTOMY    . CORONARY STENT PLACEMENT         No family history on file.  Social History   Tobacco Use  . Smoking status: Never Smoker  . Smokeless tobacco: Never Used  Vaping Use  . Vaping Use: Never used  Substance Use Topics  . Alcohol use: Yes  . Drug use: No    Home Medications Prior to Admission medications   Medication Sig Start Date End Date Taking? Authorizing Provider  aspirin 81 MG tablet Take 81 mg by mouth daily.      [provider]  atorvastatin (LIPITOR) 40 MG tablet Take 1 tablet (40 mg total) by mouth daily. 02/06/15   Burnell Blanks, MD    dorzolamide-timolol (COSOPT) 22.3-6.8 MG/ML ophthalmic solution Place 1 drop into both eyes daily. 08/26/17   [provider]  lisinopril (PRINIVIL,ZESTRIL) 5 MG tablet Take 1 tablet (5 mg total) by mouth daily. 02/06/15   Burnell Blanks, MD  LORazepam (ATIVAN) 0.5 MG tablet Take 1 tablet by mouth as needed. 10/18/18   [provider]  LUMIGAN 0.01 % SOLN Place 1 drop into both eyes daily. 10/17/17   [provider]    Allergies    Gadolinium derivatives, Multihance [gadobenate], and Iodine  Review of Systems   Review of Systems  Constitutional: Positive for activity change. Negative for chills and fever.  Respiratory: Negative for shortness of breath.   Cardiovascular: Negative for chest pain.  Gastrointestinal: Positive for abdominal pain.  Genitourinary: Positive for dysuria.  All other systems reviewed and are negative.   Physical Exam Updated Vital Signs BP 119/70 (BP Location: Right Arm)   Pulse 66   Temp 97.6 F (36.4 C) (Oral)   Resp 16   Ht 5\' 11"  (1.803 m)   Wt 79.4 kg   SpO2 100%   BMI 24.41 kg/m   Physical Exam Vitals and nursing note reviewed.  Constitutional:      Appearance: He is well-developed.  HENT:     Head: Atraumatic.  Cardiovascular:  Rate and Rhythm: Normal rate.  Pulmonary:     Effort: Pulmonary effort is normal.  Abdominal:     General: There is distension.     Tenderness: There is abdominal tenderness.  Musculoskeletal:     Cervical back: Neck supple.  Skin:    General: Skin is warm.  Neurological:     Mental Status: He is alert and oriented to person, place, and time.     ED Results / Procedures / Treatments   Labs (all labs ordered are listed, but only abnormal results are displayed) Labs Reviewed  BASIC METABOLIC PANEL - Abnormal; Notable for the following components:      Result Value   Glucose, Bld 109 (*)    Calcium 8.8 (*)    GFR calc non Af Amer 56 (*)    All other components  within normal limits  URINALYSIS, ROUTINE W REFLEX MICROSCOPIC - Abnormal; Notable for the following components:   Color, Urine STRAW (*)    Specific Gravity, Urine 1.002 (*)    Hgb urine dipstick MODERATE (*)    Bacteria, UA RARE (*)    All other components within normal limits  URINE CULTURE  CBC WITH DIFFERENTIAL/PLATELET    EKG None  Radiology No results found.  Procedures Procedures (including critical care time)  Medications Ordered in ED Medications - No data to display  ED Course  I have reviewed the triage vital signs and the nursing notes.  Pertinent labs & imaging results that were available during my care of the patient were reviewed by me and considered in my medical decision making (see chart for details).  Clinical Course as of May 24 502  Sat May 24, 2020  0502 Urine looks clean. Upon further questioning, patient denies any burning with urination or dysuria.  He has no back pain.  We will not start him on any antibiotics.  Urology follow-up requested.  Results of the ED work-up discussed.  Patient has more than 5 cc in his Foley and feels a lot better.  Urinalysis, Routine w reflex microscopic(!) [AN]    Clinical Course User Index [AN] Varney Biles, MD   MDM Rules/Calculators/A&P                          79 year old comes in a chief complaint of abdominal pain. He is noted to have abdominal distention.  He is complaining of burning with urination and decreased urination.  Concerns are that he is having acute urinary retention.  Patient denies any history of prostate issues.  No new medications.  Unknown etiology for acute urinary retention right now.  Bladder scan revealed almost 700 cc of urine in his bladder.  Foley catheter to be placed, urine to be sent to screen for infection.  Final Clinical Impression(s) / ED Diagnoses Final diagnoses:  Urinary retention    Rx / DC Orders ED Discharge Orders    None       Varney Biles, MD 05/24/20  0330    Varney Biles, MD 05/24/20 5093

## 2020-05-25 LAB — URINE CULTURE: Culture: NO GROWTH

## 2021-02-19 ENCOUNTER — Inpatient Hospital Stay (HOSPITAL_COMMUNITY)
Admission: EM | Admit: 2021-02-19 | Discharge: 2021-02-21 | DRG: 246 | Disposition: A | Discharge status: Home / Self Care | Payer: Medicare HMO | Attending: Cardiology | Admitting: Cardiology

## 2021-02-19 ENCOUNTER — Emergency Department (HOSPITAL_COMMUNITY): Payer: Medicare HMO

## 2021-02-19 ENCOUNTER — Encounter (HOSPITAL_COMMUNITY): Payer: Self-pay | Admitting: Emergency Medicine

## 2021-02-19 ENCOUNTER — Other Ambulatory Visit: Payer: Self-pay

## 2021-02-19 DIAGNOSIS — I10 Essential (primary) hypertension: Secondary | ICD-10-CM

## 2021-02-19 DIAGNOSIS — I214 Non-ST elevation (NSTEMI) myocardial infarction: Secondary | ICD-10-CM | POA: Diagnosis present

## 2021-02-19 DIAGNOSIS — Z79899 Other long term (current) drug therapy: Secondary | ICD-10-CM

## 2021-02-19 DIAGNOSIS — E782 Mixed hyperlipidemia: Secondary | ICD-10-CM | POA: Diagnosis present

## 2021-02-19 DIAGNOSIS — I25110 Atherosclerotic heart disease of native coronary artery with unstable angina pectoris: Secondary | ICD-10-CM | POA: Diagnosis present

## 2021-02-19 DIAGNOSIS — Y831 Surgical operation with implant of artificial internal device as the cause of abnormal reaction of the patient, or of later complication, without mention of misadventure at the time of the procedure: Secondary | ICD-10-CM | POA: Diagnosis present

## 2021-02-19 DIAGNOSIS — I252 Old myocardial infarction: Secondary | ICD-10-CM

## 2021-02-19 DIAGNOSIS — I2510 Atherosclerotic heart disease of native coronary artery without angina pectoris: Secondary | ICD-10-CM

## 2021-02-19 DIAGNOSIS — Z91041 Radiographic dye allergy status: Secondary | ICD-10-CM

## 2021-02-19 DIAGNOSIS — Z7982 Long term (current) use of aspirin: Secondary | ICD-10-CM

## 2021-02-19 DIAGNOSIS — Z955 Presence of coronary angioplasty implant and graft: Secondary | ICD-10-CM

## 2021-02-19 DIAGNOSIS — Z9861 Coronary angioplasty status: Secondary | ICD-10-CM

## 2021-02-19 DIAGNOSIS — Z9049 Acquired absence of other specified parts of digestive tract: Secondary | ICD-10-CM

## 2021-02-19 DIAGNOSIS — H409 Unspecified glaucoma: Secondary | ICD-10-CM | POA: Diagnosis present

## 2021-02-19 DIAGNOSIS — T82855A Stenosis of coronary artery stent, initial encounter: Principal | ICD-10-CM | POA: Diagnosis present

## 2021-02-19 DIAGNOSIS — Z888 Allergy status to other drugs, medicaments and biological substances status: Secondary | ICD-10-CM

## 2021-02-19 DIAGNOSIS — I081 Rheumatic disorders of both mitral and tricuspid valves: Secondary | ICD-10-CM | POA: Diagnosis present

## 2021-02-19 DIAGNOSIS — E785 Hyperlipidemia, unspecified: Secondary | ICD-10-CM

## 2021-02-19 DIAGNOSIS — Z20822 Contact with and (suspected) exposure to covid-19: Secondary | ICD-10-CM | POA: Diagnosis present

## 2021-02-19 HISTORY — DX: Atherosclerotic heart disease of native coronary artery without angina pectoris: I25.10

## 2021-02-19 LAB — CBC
HCT: 45.1 % (ref 39.0–52.0)
Hemoglobin: 14.8 g/dL (ref 13.0–17.0)
MCH: 31 pg (ref 26.0–34.0)
MCHC: 32.8 g/dL (ref 30.0–36.0)
MCV: 94.4 fL (ref 80.0–100.0)
Platelets: 170 10*3/uL (ref 150–400)
RBC: 4.78 MIL/uL (ref 4.22–5.81)
RDW: 12.4 % (ref 11.5–15.5)
WBC: 5.7 10*3/uL (ref 4.0–10.5)
nRBC: 0 % (ref 0.0–0.2)

## 2021-02-19 LAB — TROPONIN I (HIGH SENSITIVITY)
Troponin I (High Sensitivity): 297 ng/L (ref <18)
Troponin I (High Sensitivity): 370 ng/L (ref <18)
Troponin I (High Sensitivity): 1019 ng/L (ref <18)

## 2021-02-19 LAB — BASIC METABOLIC PANEL
Anion gap: 6 (ref 5–15)
BUN: 18 mg/dL (ref 8–23)
CO2: 27 mmol/L (ref 22–32)
Calcium: 9.2 mg/dL (ref 8.9–10.3)
Chloride: 105 mmol/L (ref 98–111)
Creatinine, Ser: 1.27 mg/dL — ABNORMAL HIGH (ref 0.61–1.24)
GFR, Estimated: 57 mL/min — ABNORMAL LOW (ref >60)
Glucose, Bld: 104 mg/dL — ABNORMAL HIGH (ref 70–99)
Potassium: 4.6 mmol/L (ref 3.5–5.1)
Sodium: 138 mmol/L (ref 135–145)

## 2021-02-19 LAB — RESP PANEL BY RT-PCR (FLU A&B, COVID) ARPGX2
Influenza A by PCR: NEGATIVE
Influenza B by PCR: NEGATIVE
SARS Coronavirus 2 by RT PCR: NEGATIVE

## 2021-02-19 MED ORDER — DORZOLAMIDE HCL-TIMOLOL MAL 2-0.5 % OP SOLN
1.0000 [drp] | Freq: Every day | OPHTHALMIC | Status: DC
  Administered 2021-02-20 – 2021-02-21 (×2): 1 [drp] via OPHTHALMIC
  Filled 2021-02-19: qty 10

## 2021-02-19 MED ORDER — SODIUM CHLORIDE 0.9 % WEIGHT BASED INFUSION
3.0000 mL/kg/h | INTRAVENOUS | Status: DC
Start: 2021-02-20 — End: 2021-02-20
  Administered 2021-02-20: 3 mL/kg/h via INTRAVENOUS

## 2021-02-19 MED ORDER — ONDANSETRON HCL 4 MG/2ML IJ SOLN: 4.0000 mg | Freq: Four times a day (QID) | INTRAMUSCULAR | Status: DC | PRN

## 2021-02-19 MED ORDER — LORAZEPAM 0.5 MG PO TABS: 0.5000 mg | ORAL_TABLET | Freq: Every evening | ORAL | Status: DC | PRN

## 2021-02-19 MED ORDER — ACETAMINOPHEN 325 MG PO TABS: 650.0000 mg | ORAL_TABLET | ORAL | Status: DC | PRN

## 2021-02-19 MED ORDER — SODIUM CHLORIDE 0.9 % WEIGHT BASED INFUSION: 1.0000 mL/kg/h | INTRAVENOUS | Status: DC

## 2021-02-19 MED ORDER — ATORVASTATIN CALCIUM 40 MG PO TABS
40.0000 mg | ORAL_TABLET | Freq: Every day | ORAL | Status: DC
  Administered 2021-02-19 – 2021-02-21 (×3): 40 mg via ORAL
  Filled 2021-02-19 (×2): qty 1
  Filled 2021-02-19: qty 4

## 2021-02-19 MED ORDER — HEPARIN BOLUS VIA INFUSION
4000.0000 [IU] | Freq: Once | INTRAVENOUS | Status: AC
  Administered 2021-02-19: 4000 [IU] via INTRAVENOUS
  Filled 2021-02-19: qty 4000

## 2021-02-19 MED ORDER — SODIUM CHLORIDE 0.9% FLUSH: 3.0000 mL | Freq: Two times a day (BID) | INTRAVENOUS | Status: DC

## 2021-02-19 MED ORDER — ASPIRIN EC 81 MG PO TBEC
81.0000 mg | DELAYED_RELEASE_TABLET | Freq: Every day | ORAL | Status: DC
  Administered 2021-02-20 – 2021-02-21 (×2): 81 mg via ORAL
  Filled 2021-02-19 (×3): qty 1

## 2021-02-19 MED ORDER — NITROGLYCERIN 0.4 MG SL SUBL: 0.4000 mg | SUBLINGUAL_TABLET | SUBLINGUAL | Status: DC | PRN

## 2021-02-19 MED ORDER — ASPIRIN 81 MG PO CHEW
324.0000 mg | CHEWABLE_TABLET | Freq: Once | ORAL | Status: AC
  Administered 2021-02-19: 324 mg via ORAL
  Filled 2021-02-19: qty 4

## 2021-02-19 MED ORDER — PANTOPRAZOLE SODIUM 40 MG PO TBEC
40.0000 mg | DELAYED_RELEASE_TABLET | Freq: Every day | ORAL | Status: DC
  Administered 2021-02-19 – 2021-02-21 (×3): 40 mg via ORAL
  Filled 2021-02-19 (×3): qty 1

## 2021-02-19 MED ORDER — ALUM & MAG HYDROXIDE-SIMETH 200-200-20 MG/5ML PO SUSP
30.0000 mL | Freq: Once | ORAL | Status: AC
  Administered 2021-02-19: 30 mL via ORAL
  Filled 2021-02-19: qty 30

## 2021-02-19 MED ORDER — SODIUM CHLORIDE 0.9% FLUSH: 3.0000 mL | INTRAVENOUS | Status: DC | PRN

## 2021-02-19 MED ORDER — HEPARIN (PORCINE) 25000 UT/250ML-% IV SOLN
1200.0000 [IU]/h | INTRAVENOUS | Status: DC
  Administered 2021-02-19: 1200 [IU]/h via INTRAVENOUS
  Filled 2021-02-19 (×3): qty 250

## 2021-02-19 MED ORDER — LIDOCAINE VISCOUS HCL 2 % MT SOLN
15.0000 mL | Freq: Once | OROMUCOSAL | Status: AC
  Administered 2021-02-19: 15 mL via ORAL
  Filled 2021-02-19: qty 15

## 2021-02-19 MED ORDER — LATANOPROST 0.005 % OP SOLN
1.0000 [drp] | Freq: Every day | OPHTHALMIC | Status: DC
  Administered 2021-02-20 – 2021-02-21 (×2): 1 [drp] via OPHTHALMIC
  Filled 2021-02-19: qty 2.5

## 2021-02-19 MED ORDER — SODIUM CHLORIDE 0.9 % IV SOLN: 250.0000 mL | INTRAVENOUS | Status: DC | PRN

## 2021-02-19 MED ORDER — LISINOPRIL 5 MG PO TABS
5.0000 mg | ORAL_TABLET | Freq: Every day | ORAL | Status: DC
  Administered 2021-02-20 – 2021-02-21 (×2): 5 mg via ORAL
  Filled 2021-02-19 (×2): qty 1

## 2021-02-19 NOTE — ED Provider Notes (Addendum)
[facility] EMERGENCY DEPARTMENT Provider Note   CSN: [id number] Arrival date & time: 02/19/21  1421     History No chief complaint on file.   [name] is a 80 y.o. male.  HPI Patient presents with chest pain.  States he woke up with this morning.  It is dull in his mid chest.  States it feels like indigestion.  Had some relief with Pepto at home.  Does have a history of coronary artery disease with stenting.  States that one of his heart attacks did feel like indigestion but states this 1 feels different.  Has been doing well the last few days.  Normal activity yesterday.  Symptoms of been pretty constant today.  No swelling in his legs.  No cough.  Not feeling short of breath.    Past Medical History:  Diagnosis Date  . 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 Hyperlipidemia    with low high-density lipoprotein  . Kidney stone     Patient Active Problem List   Diagnosis Date Noted  . HYPERLIPIDEMIA-MIXED 07/22/2009  . CAD, NATIVE VESSEL 07/22/2009    Past Surgical History:  Procedure Laterality Date  . BACK SURGERY    . CHOLECYSTECTOMY    . CORONARY STENT PLACEMENT         History reviewed. No pertinent family history.  Social History   Tobacco Use  . Smoking status: Never Smoker  . Smokeless tobacco: Never Used  Vaping Use  . Vaping Use: Never used  Substance Use Topics  . Alcohol use: Yes  . Drug use: No    Home Medications Prior to Admission medications   Medication Sig Start Date End Date Taking? Authorizing Provider  aspirin 81 MG tablet Take 81 mg by mouth daily.      [provider]  atorvastatin (LIPITOR) 40 MG tablet Take 1 tablet (40 mg total) by mouth daily. 02/06/15   Burnell Blanks, MD  dorzolamide-timolol (COSOPT) 22.3-6.8 MG/ML  ophthalmic solution Place 1 drop into both eyes daily. 08/26/17   [provider]  lisinopril (PRINIVIL,ZESTRIL) 5 MG tablet Take 1 tablet (5 mg total) by mouth daily. 02/06/15   Burnell Blanks, MD  LORazepam (ATIVAN) 0.5 MG tablet Take 1 tablet by mouth as needed. 10/18/18   [provider]  LUMIGAN 0.01 % SOLN Place 1 drop into both eyes daily. 10/17/17   [provider]    Allergies    Gadolinium derivatives, Multihance [gadobenate], and Iodine  Review of Systems   Review of Systems  Constitutional: Negative for appetite change and diaphoresis.  HENT: Negative for congestion.   Respiratory: Negative for cough and shortness of breath.   Cardiovascular: Positive for chest pain. Negative for leg swelling.  Gastrointestinal: Negative for abdominal pain, nausea and vomiting.  Genitourinary: Negative for flank pain.  Musculoskeletal: Negative for back pain.  Skin: Negative for rash.  Neurological: Negative for weakness.  Psychiatric/Behavioral: Negative for confusion.    Physical Exam Updated Vital Signs BP 118/76   Pulse 64   Temp 98.5 F (36.9 C)   Resp 16   SpO2 97%   Physical Exam Vitals and nursing note reviewed.  HENT:     Head: Atraumatic.  Eyes:     Pupils: Pupils are equal, round, and reactive to light.  Cardiovascular:     Rate and Rhythm: Regular rhythm.  Pulmonary:     Breath sounds: Normal breath sounds.  Chest:     Chest wall: No tenderness.  Abdominal:     Tenderness: There is no abdominal tenderness.  Musculoskeletal:        General: No tenderness.  Skin:    General: Skin is warm.     Capillary Refill: Capillary refill takes less than 2 seconds.  Neurological:     Mental Status: He is alert and oriented to person, place, and time.  Psychiatric:        Mood and Affect: Mood normal.     ED Results / Procedures / Treatments   Labs (all labs ordered are listed, but only abnormal results are displayed) Labs  Reviewed  BASIC METABOLIC PANEL - Abnormal; Notable for the following components:      Result Value   Glucose, Bld 104 (*)    Creatinine, Ser 1.27 (*)    GFR, Estimated 57 (*)    All other components within normal limits  TROPONIN I (HIGH SENSITIVITY) - Abnormal; Notable for the following components:   Troponin I (High Sensitivity) 297 (*)    All other components within normal limits  RESP PANEL BY RT-PCR (FLU A&B, COVID) ARPGX2  CBC  TROPONIN I (HIGH SENSITIVITY)    EKG EKG Interpretation  Date/Time:  Thursday February 19 2021 14:29:33 EDT Ventricular Rate:  76 PR Interval:  142 QRS Duration: 86 QT Interval:  364 QTC Calculation: 409 R Axis:   39 Text Interpretation: Normal sinus rhythm T wave abnormality, consider inferior ischemia Abnormal ECG No significant change since last tracing Confirmed by Davonna Belling [id number]) on 02/19/2021 3:04:25 PM   Radiology DG Chest 2 View  Result Date: 02/19/2021 CLINICAL DATA:  Chest pain EXAM: CHEST - 2 VIEW COMPARISON:  None. FINDINGS: The heart size and mediastinal contours are within normal limits. Both lungs are clear. The visualized skeletal structures are unremarkable. IMPRESSION: No active cardiopulmonary disease. Electronically Signed   By: Inez Catalina M.D.   On: 02/19/2021 15:15    Procedures Procedures   Medications Ordered in ED Medications  aspirin chewable tablet 324 mg (has no administration in time range)  alum & mag hydroxide-simeth (MAALOX/MYLANTA) 200-200-20 MG/5ML suspension 30 mL (30 mLs Oral Given 02/19/21 1536)    And  lidocaine (XYLOCAINE) 2 % viscous mouth solution 15 mL (15 mLs Oral Given 02/19/21 1536)    ED Course  I have reviewed the triage vital signs and the nursing notes.  Pertinent labs & imaging results that were available during my care of the patient were reviewed by me and considered in my medical decision making (see chart for details).    MDM Rules/Calculators/A&P                           Patient with chest pain.  Began this morning.  Does have cardiac history but states this feels different.  Has EKG but stable.  Chest x-ray reassuring but lab work pending.  Care will be turned over to Dr. Ralene Bathe  Troponin has come back elevated.  Pain-free now.  Will add aspirin and heparin.  Admit to cardiology.  CRITICAL CARE Performed by: Davonna Belling Total critical care time: 30 minutes Critical care time was exclusive of separately billable procedures and treating other patients. Critical care was necessary to treat or prevent imminent or life-threatening deterioration. Critical care was time spent personally  by me on the following activities: development of treatment plan with patient and/or surrogate as well as nursing, discussions with consultants, evaluation of patient's response to treatment, examination of patient, obtaining history from patient or surrogate, ordering and performing treatments and interventions, ordering and review of laboratory studies, ordering and review of radiographic studies, pulse oximetry and re-evaluation of patient's condition.    Final Clinical Impression(s) / ED Diagnoses Final diagnoses:  NSTEMI (non-ST elevated myocardial infarction) [facility])    Rx / Hannah Orders ED Discharge Orders    None       Davonna Belling, MD 02/19/21 1613    Davonna Belling, MD 02/19/21 1620

## 2021-02-19 NOTE — H&P (Addendum)
Cardiology Admission History and Physical:   Keith Keith: Keith Keith MRN: [id number]; DOB: July 15, 1941   Admission date: 02/19/2021  PCP:  Keith Der, MD   Keith Keith  Cardiologist:  Keith Chandler, MD  Electrophysiologist:  None   Chief Complaint:  Chest Pain  Keith Profile:   Keith Keith is a 80 y.o. 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  History of Present Illness:   Keith Keith is a 80 year old male with Keith above history who is followed by Dr. Angelena [name]. Keith has done well from a cardiac standpoint over Keith last several years. He has not had an ischemic evaluation since his MI in 2009. He was last seen by Dr. Angelena [name] in 12/2019 at which time he was doing well from a cardiac standpoint. Repeat Echo was ordered to for routine evaluation of mitral regurgitation. Echo showed LVEF of 62% with normal wall motion, grade 1 diastolic dysfunction, and mild MR.  Keith presented to Keith ED today for further evaluation of chest pain. Keith developed chest pain today that felt similar to Keith pain he had years ago prior to PCI. He has difficult describing Keith pain and just states it was "uncomfortable" and "grabbed his attention." He told Keith ED provider it felt like indigestion and reportedly had some relief with Pepto. He also mentions that it felt better when he went outside in Keith "fresh air." Pain lasted for a "pretty good while" so Keith asked wife to drive him to Keith ED. Keith walks 30 minutes twice and day and upon further question it sounds like he sometime has some chest pain with this. Keith denies any shortness of breath, palpitations, syncope. He notes occasional  lightheadedness/dizziness if he stands to quickly. No recent fevers, body aches, chills, or illnesses. No abnormal bleeding.   In Keith ED, vitals stable. EKG showed T wave inversions in leads III and AVF and very minimal ones in V6 which are new. Initial high-sensitivity troponin elevated at 297. Repeat pending. Chest x-ray showed no acute findings. WBC 5.7, Hgb 14.8, Plts 170. Na 138, K 4.6, Glucose 104, BUN 18, Cr 1.27. Respiratory panel negative for COVID and influenza. Keith was started on IV Heparin and given Aspirin and GI cocktail.  At Keith time of this evaluation, Keith chest pain free.  Keith denies any history of tobacco use. He is unsure if he has any family history of cardiovascular disease. He thinks one of his uncles may of had heart disease but is unsure. He states a lot of his family members had cancer though.  Past Medical History:  Diagnosis Date  . 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 Hyperlipidemia    with low high-density lipoprotein  . Kidney stone     Past Surgical History:  Procedure Laterality Date  . BACK SURGERY    . CHOLECYSTECTOMY    . CORONARY STENT PLACEMENT       Medications Prior to Admission: Prior to Admission medications   Medication Sig Start Date End Date Taking? Authorizing Provider  aspirin  81 MG tablet Take 81 mg by mouth daily.   Yes [provider]  atorvastatin (LIPITOR) 40 MG tablet Take 1 tablet (40 mg total) by mouth daily. 02/06/15  Yes Burnell Blanks, MD  dorzolamide-timolol (COSOPT) 22.3-6.8 MG/ML ophthalmic solution Place 1 drop into both eyes daily. 08/26/17  Yes [provider]  lisinopril (PRINIVIL,ZESTRIL) 5 MG tablet Take 1 tablet (5 mg total) by mouth daily. 02/06/15  Yes Burnell Blanks, MD  LORazepam (ATIVAN) 0.5 MG  tablet Take 1 tablet by mouth as needed for sleep. 10/18/18  Yes [provider]  LUMIGAN 0.01 % SOLN Place 1 drop into both eyes daily. 10/17/17  Yes [provider]     Allergies:    Allergies  Allergen Reactions  . Gadolinium Derivatives Hives  . Multihance [Gadobenate] Hives    [name]  . Iodine Rash    Social History:   Social History   Socioeconomic History  . Marital status: Married    Spouse name: Not on file  . Number of children: Not on file  . Years of education: Not on file  . Highest education level: Not on file  Occupational History  . Not on file  Tobacco Use  . Smoking status: Never Smoker  . Smokeless tobacco: Never Used  Vaping Use  . Vaping Use: Never used  Substance and Sexual Activity  . Alcohol use: Yes  . Drug use: No  . Sexual activity: Not on file  Other Topics Concern  . Not on file  Social History Narrative  . Not on file   Social Determinants of Health   Financial Resource Strain: Not on file  Food Insecurity: Not on file  Transportation Needs: Not on file  Physical Activity: Not on file  Stress: Not on file  Social Connections: Not on file  Intimate Partner Violence: Not on file    Family History:   Please see above.  ROS:  Please see Keith history of present illness.  All other ROS reviewed and negative.     Physical Exam/Data:   Vitals:   02/19/21 1745 02/19/21 1830 02/19/21 1922 02/19/21 2005  BP: 119/76 129/74 129/89 127/78  Pulse: 62 61 62 64  Resp: 18 18 19 19   Temp:    98.4 F (36.9 C)  TempSrc:    Oral  SpO2: 96% 97% 97% 96%  Weight:    76 kg  Height:    5\' 11"  (1.803 m)   No intake or output data in Keith 24 hours ending 02/19/21 2219 Last 3 Weights 02/19/2021 02/19/2021 05/24/2020  Weight (lbs) 167 lb 8.8 oz 180 lb 175 lb  Weight (kg) 76 kg 81.647 kg 79.379 kg     Body mass index is 23.37 kg/m.   Physical Exam per MD:  General: 80 y.o. male resting comfortably  in no acute distress.  HEENT: Normocephalic and atraumatic. Sclera clear.  Neck: Supple. No carotid bruits. No JVD. Heart: RRR. Distinct S1 and S2. No murmurs, gallops, or rubs. Radial and distal pedal pulses 2+ and equal bilaterally. Lungs: No increased work of breathing. Clear to ausculation bilaterally. No wheezes, rhonchi, or rales.  Abdomen: Soft, non-distended, and non-tender to palpation. Bowel sounds present. Extremities: No lower extremity edema.    Skin: Warm and dry. Neuro: Alert and oriented x3. No focal deficits. Psych: Normal affect. Responds appropriately.   EKG:  Keith ECG that was done was personally reviewed and demonstrates normal sinus rhythm, rate 76  bpm, with T wave inversions in leads III and AVF and very minimal ones in V6 which are new. Normal axis. Normal PR and QRS intervals. QTc 409 ms.  Relevant CV Studies:  Echocardiogram 02/07/2020: Impressions: 1. Left ventricular ejection fraction by 3D volume is 62 %. Keith left  ventricle has normal function. Keith left ventricle has no regional wall  motion abnormalities. Left ventricular diastolic parameters are consistent  with Grade I diastolic dysfunction  (impaired relaxation). Keith average left ventricular global longitudinal  strain is -23.1 %.  2. Right ventricular systolic function is normal. Keith right ventricular  size is normal. There is normal pulmonary artery systolic pressure. Keith  estimated right ventricular systolic pressure is 14.7 mmHg.  3. Keith mitral valve is normal in structure. Mild mitral valve  regurgitation. No evidence of mitral stenosis.  4. Keith aortic valve is normal in structure. Aortic valve regurgitation is  not visualized. No aortic stenosis is present.  5. Keith inferior vena cava is normal in size with greater than 50%  respiratory variability, suggesting right atrial pressure of 3 mmHg.   Comparison(s): A prior study was performed on 08/25/2012. Prior images  unable to be directly  viewed, comparison made by report only. No  significant change from prior study.    Laboratory Data:  High Sensitivity Troponin:   Recent Labs  Lab 02/19/21 1434 02/19/21 1634 02/19/21 1933  TROPONINIHS 297* 370* 1,019*      Chemistry Recent Labs  Lab 02/19/21 1434  NA 138  K 4.6  CL 105  CO2 27  GLUCOSE 104*  BUN 18  CREATININE 1.27*  CALCIUM 9.2  GFRNONAA 57*  ANIONGAP 6    No results for input(s): PROT, ALBUMIN, AST, ALT, ALKPHOS, BILITOT in Keith last 168 hours. Hematology Recent Labs  Lab 02/19/21 1434  WBC 5.7  RBC 4.78  HGB 14.8  HCT 45.1  MCV 94.4  MCH 31.0  MCHC 32.8  RDW 12.4  PLT 170   BNPNo results for input(s): BNP, PROBNP in Keith last 168 hours.  DDimer No results for input(s): DDIMER in Keith last 168 hours.   Radiology/Studies:  DG Chest 2 View  Result Date: 02/19/2021 CLINICAL DATA:  Chest pain EXAM: CHEST - 2 VIEW COMPARISON:  None. FINDINGS: Keith heart size and mediastinal contours are within normal limits. Both lungs are clear. Keith visualized skeletal structures are unremarkable. IMPRESSION: No active cardiopulmonary disease. Electronically Signed   By: Inez Catalina M.D.   On: 02/19/2021 15:15     Assessment and Plan:   NSTEMI History of CAD  - History of CAD with multiple PCIs to RCA (last in 2009). Presents now with chest pain similar to prior cardiac pain. Currently chest pain free.  - EKG shows T wave inversions in inferior leads.  - Initial high-sensitivity troponin elevated at 297. Repeat pending. - Will check Echo. - Will check fasting lipid panel and hemoglobin A1c. - Continue IV Heparin.  - Continue aspirin and high-intensity statin. No beta-blocker given low resting heart rates. - Will start Protonix for possible reflux.  - Plan is for [facility] tomorrow. Keith Keith understands that risks include but are not limited to stroke (1 in 1000), death (1 in 63), kidney failure [usually temporary] (1 in 500), bleeding (1 in 200),  allergic reaction [possibly serious] (1 in 200), and agrees to proceed.   Hypertension - BP well controlled. - Continue home Lisinopril 5mg  daily.  Hyperlipidemia - Continue home Lipitor 40mg  daily. - Will check fasting  lipid panel tomorrow morning.   Risk Assessment/Risk Scores:   TIMI Risk Score for Unstable Angina or Non-ST Elevation MI:   Keith Keith's TIMI risk score is 5, which indicates a 26% risk of all cause mortality, new or recurrent myocardial infarction or need for urgent revascularization in Keith next 14 days.{    Severity of Illness: Keith appropriate Keith status for this Keith is OBSERVATION. Observation status is judged to be reasonable and necessary in order to provide Keith required intensity of service to ensure Keith Keith's safety. Keith Keith's presenting symptoms, physical exam findings, and initial radiographic and laboratory data in Keith context of their medical condition is felt to place them at decreased risk for further clinical deterioration. Furthermore, it is anticipated that Keith Keith will be medically stable for discharge from Keith hospital within 2 midnights of admission. Keith following factors support Keith Keith status of observation.   " Keith Keith's presenting symptoms include chest pain. " Keith physical exam findings as above. " Keith initial radiographic and laboratory data as above.     For questions or updates, please contact [place] Please consult [email or web address] for contact info under     Signed, Candee Furbish, MD  02/19/2021 10:19 PM   Personally seen and examined. Agree with above.   55 with known CAD here with NSTEMI.  Prior RCA BMS then 07/25/2008 2 overlapping DES  Chest pressure earlier today. Has felt occasionally with exertion.   GEN: Well nourished, well developed, in no acute distress  HEENT: normal  Neck: no JVD, carotid bruits, or masses Cardiac: RRR; no murmurs, rubs, or gallops,no edema  Respiratory:  clear to  auscultation bilaterally, normal work of breathing GI: soft, nontender, nondistended, + BS MS: no deformity or atrophy  Skin: warm and dry, no rash Neuro:  Alert and Oriented x 3, Strength and sensation are intact Psych: euthymic mood, full affect  Trop 297 to 1000  A/P:  NSTEMI  - cath tomorrow  - No beta blocker with bradycardia  - IV heparin  - high intensity statin.   CAD  - RCA stents prior as above  Candee Furbish, MD

## 2021-02-19 NOTE — ED Triage Notes (Signed)
Pt here from home with c/o chest pain and indigestion , pt has hx MI with 4 stents

## 2021-02-19 NOTE — Progress Notes (Signed)
ANTICOAGULATION CONSULT NOTE - Initial Consult  Pharmacy Consult for heparin Indication: chest pain/ACS  Allergies  Allergen Reactions  . Gadolinium Derivatives Hives  . Multihance [Gadobenate] Hives    [name]  . Iodine Rash    Patient Measurements: Height: 5\' 11"  (180.3 cm) Weight: 81.6 kg (180 lb) IBW/kg (Calculated) : 75.3 Heparin Dosing Weight: 81.6 kg  Vital Signs: Temp: 98.5 F (36.9 C) (03/24 1430) BP: 118/76 (03/24 1545) Pulse Rate: 64 (03/24 1545)  Labs: Recent Labs    02/19/21 1434  HGB 14.8  HCT 45.1  PLT 170  CREATININE 1.27*  TROPONINIHS 297*    Estimated Creatinine Clearance: 48.6 mL/min (A) (by C-G formula based on SCr of 1.27 mg/dL (H)).   Medical History: Past Medical History:  Diagnosis Date  . 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 Hyperlipidemia    with low high-density lipoprotein  . Kidney stone     Assessment: 39 YOM admitted with ACS. No AC PTA. Pharmacy has been consulted to dose heparin. CBC WNL.  Goal of Therapy:  Heparin level 0.3-0.7 units/ml Monitor platelets by anticoagulation protocol: Yes   Plan:  Heparin 4000 units x1, followed by 1200 units/hr Heparin level in 8 hours Daily heparin level, CBC  Romilda Garret, PharmD PGY1 Acute Care Pharmacy Resident 02/19/2021 4:30 PM  Please check [email or web address] for unit specific pharmacy phone numbers.

## 2021-02-20 ENCOUNTER — Encounter (HOSPITAL_COMMUNITY)
Admission: EM | Disposition: A | Discharge status: Home / Self Care | Payer: Self-pay | Source: Home / Self Care | Attending: Cardiology

## 2021-02-20 ENCOUNTER — Observation Stay (HOSPITAL_BASED_OUTPATIENT_CLINIC_OR_DEPARTMENT_OTHER): Payer: Medicare HMO

## 2021-02-20 ENCOUNTER — Encounter (HOSPITAL_COMMUNITY): Payer: Self-pay | Admitting: Cardiology

## 2021-02-20 DIAGNOSIS — Z79899 Other long term (current) drug therapy: Secondary | ICD-10-CM | POA: Diagnosis not present

## 2021-02-20 DIAGNOSIS — T82855A Stenosis of coronary artery stent, initial encounter: Secondary | ICD-10-CM | POA: Diagnosis present

## 2021-02-20 DIAGNOSIS — Z20822 Contact with and (suspected) exposure to covid-19: Secondary | ICD-10-CM | POA: Diagnosis present

## 2021-02-20 DIAGNOSIS — Z955 Presence of coronary angioplasty implant and graft: Secondary | ICD-10-CM

## 2021-02-20 DIAGNOSIS — I10 Essential (primary) hypertension: Secondary | ICD-10-CM | POA: Diagnosis present

## 2021-02-20 DIAGNOSIS — Z888 Allergy status to other drugs, medicaments and biological substances status: Secondary | ICD-10-CM | POA: Diagnosis not present

## 2021-02-20 DIAGNOSIS — I214 Non-ST elevation (NSTEMI) myocardial infarction: Secondary | ICD-10-CM | POA: Diagnosis present

## 2021-02-20 DIAGNOSIS — Z7982 Long term (current) use of aspirin: Secondary | ICD-10-CM | POA: Diagnosis not present

## 2021-02-20 DIAGNOSIS — I25110 Atherosclerotic heart disease of native coronary artery with unstable angina pectoris: Secondary | ICD-10-CM | POA: Diagnosis present

## 2021-02-20 DIAGNOSIS — Z9049 Acquired absence of other specified parts of digestive tract: Secondary | ICD-10-CM | POA: Diagnosis not present

## 2021-02-20 DIAGNOSIS — I252 Old myocardial infarction: Secondary | ICD-10-CM | POA: Diagnosis not present

## 2021-02-20 DIAGNOSIS — Z91041 Radiographic dye allergy status: Secondary | ICD-10-CM | POA: Diagnosis not present

## 2021-02-20 DIAGNOSIS — I081 Rheumatic disorders of both mitral and tricuspid valves: Secondary | ICD-10-CM | POA: Diagnosis present

## 2021-02-20 DIAGNOSIS — E782 Mixed hyperlipidemia: Secondary | ICD-10-CM | POA: Diagnosis present

## 2021-02-20 DIAGNOSIS — Y831 Surgical operation with implant of artificial internal device as the cause of abnormal reaction of the patient, or of later complication, without mention of misadventure at the time of the procedure: Secondary | ICD-10-CM | POA: Diagnosis present

## 2021-02-20 DIAGNOSIS — H409 Unspecified glaucoma: Secondary | ICD-10-CM | POA: Diagnosis present

## 2021-02-20 HISTORY — PX: LEFT HEART CATH AND CORONARY ANGIOGRAPHY: CATH118249

## 2021-02-20 HISTORY — PX: CORONARY STENT INTERVENTION: CATH118234

## 2021-02-20 LAB — BASIC METABOLIC PANEL
Anion gap: 8 (ref 5–15)
BUN: 19 mg/dL (ref 8–23)
CO2: 25 mmol/L (ref 22–32)
Calcium: 9 mg/dL (ref 8.9–10.3)
Chloride: 105 mmol/L (ref 98–111)
Creatinine, Ser: 1.08 mg/dL (ref 0.61–1.24)
GFR, Estimated: >60 mL/min (ref >60)
Glucose, Bld: 113 mg/dL — ABNORMAL HIGH (ref 70–99)
Potassium: 3.9 mmol/L (ref 3.5–5.1)
Sodium: 138 mmol/L (ref 135–145)

## 2021-02-20 LAB — CBC
HCT: 39.4 % (ref 39.0–52.0)
Hemoglobin: 13.8 g/dL (ref 13.0–17.0)
MCH: 31.9 pg (ref 26.0–34.0)
MCHC: 35 g/dL (ref 30.0–36.0)
MCV: 91.2 fL (ref 80.0–100.0)
Platelets: 139 10*3/uL — ABNORMAL LOW (ref 150–400)
RBC: 4.32 MIL/uL (ref 4.22–5.81)
RDW: 12.7 % (ref 11.5–15.5)
WBC: 5.7 10*3/uL (ref 4.0–10.5)
nRBC: 0 % (ref 0.0–0.2)

## 2021-02-20 LAB — HEMOGLOBIN A1C
Hgb A1c MFr Bld: 5.9 % — ABNORMAL HIGH (ref 4.8–5.6)
Mean Plasma Glucose: 122.63 mg/dL

## 2021-02-20 LAB — ECHOCARDIOGRAM COMPLETE
AR max vel: 2.52 cm2
AV Area VTI: 2.39 cm2
AV Area mean vel: 2.24 cm2
AV Mean grad: 2 mmHg
AV Peak grad: 3.6 mmHg
Ao pk vel: 0.95 m/s
Area-P 1/2: 2.39 cm2
Height: 71 in
S' Lateral: 3.2 cm
Weight: 2625.6 oz

## 2021-02-20 LAB — LIPID PANEL
Cholesterol: 125 mg/dL (ref 0–200)
HDL: 24 mg/dL — ABNORMAL LOW (ref >40)
LDL Cholesterol: 82 mg/dL (ref 0–99)
Total CHOL/HDL Ratio: 5.2 RATIO
Triglycerides: 94 mg/dL (ref <150)
VLDL: 19 mg/dL (ref 0–40)

## 2021-02-20 LAB — POCT ACTIVATED CLOTTING TIME
Activated Clotting Time: 261 seconds
Activated Clotting Time: 291 seconds
Activated Clotting Time: 368 seconds
Activated Clotting Time: 255 seconds

## 2021-02-20 LAB — HEPARIN LEVEL (UNFRACTIONATED): Heparin Unfractionated: 0.46 IU/mL (ref 0.30–0.70)

## 2021-02-20 SURGERY — LEFT HEART CATH AND CORONARY ANGIOGRAPHY
Anesthesia: LOCAL

## 2021-02-20 MED ORDER — HYDRALAZINE HCL 20 MG/ML IJ SOLN: 10.0000 mg | INTRAMUSCULAR | Status: AC | PRN

## 2021-02-20 MED ORDER — NITROGLYCERIN 1 MG/10 ML FOR IR/CATH LAB
INTRA_ARTERIAL | Status: AC
  Filled 2021-02-20: qty 10

## 2021-02-20 MED ORDER — LIDOCAINE HCL (PF) 1 % IJ SOLN
INTRAMUSCULAR | Status: AC
  Filled 2021-02-20: qty 30

## 2021-02-20 MED ORDER — DIPHENHYDRAMINE HCL 50 MG/ML IJ SOLN
25.0000 mg | Freq: Once | INTRAMUSCULAR | Status: AC
  Administered 2021-02-20: 25 mg via INTRAVENOUS
  Filled 2021-02-20: qty 1

## 2021-02-20 MED ORDER — CLOPIDOGREL BISULFATE 300 MG PO TABS
ORAL_TABLET | ORAL | Status: AC
  Filled 2021-02-20: qty 1

## 2021-02-20 MED ORDER — HEPARIN (PORCINE) IN NACL 1000-0.9 UT/500ML-% IV SOLN
INTRAVENOUS | Status: AC
  Filled 2021-02-20: qty 1500

## 2021-02-20 MED ORDER — HEPARIN (PORCINE) IN NACL 1000-0.9 UT/500ML-% IV SOLN
INTRAVENOUS | Status: DC | PRN
  Administered 2021-02-20 (×2): 500 mL

## 2021-02-20 MED ORDER — SODIUM CHLORIDE 0.9% FLUSH: 3.0000 mL | Freq: Two times a day (BID) | INTRAVENOUS | Status: DC

## 2021-02-20 MED ORDER — LIDOCAINE HCL (PF) 1 % IJ SOLN
INTRAMUSCULAR | Status: DC | PRN
  Administered 2021-02-20: 2 mL

## 2021-02-20 MED ORDER — SODIUM CHLORIDE 0.9 % IV SOLN: 250.0000 mL | INTRAVENOUS | Status: DC | PRN

## 2021-02-20 MED ORDER — HEPARIN SODIUM (PORCINE) 1000 UNIT/ML IJ SOLN
INTRAMUSCULAR | Status: AC
  Filled 2021-02-20: qty 1

## 2021-02-20 MED ORDER — IOHEXOL 350 MG/ML SOLN
INTRAVENOUS | Status: DC | PRN
  Administered 2021-02-20: 160 mL

## 2021-02-20 MED ORDER — SODIUM CHLORIDE 0.9 % IV SOLN: INTRAVENOUS | Status: AC

## 2021-02-20 MED ORDER — NITROGLYCERIN 1 MG/10 ML FOR IR/CATH LAB
INTRA_ARTERIAL | Status: DC | PRN
  Administered 2021-02-20 (×2): 200 ug via INTRACORONARY

## 2021-02-20 MED ORDER — SODIUM CHLORIDE 0.9% FLUSH: 3.0000 mL | INTRAVENOUS | Status: DC | PRN

## 2021-02-20 MED ORDER — FENTANYL CITRATE (PF) 100 MCG/2ML IJ SOLN
INTRAMUSCULAR | Status: AC
  Filled 2021-02-20: qty 2

## 2021-02-20 MED ORDER — CLOPIDOGREL BISULFATE 300 MG PO TABS
ORAL_TABLET | ORAL | Status: DC | PRN
  Administered 2021-02-20: 600 mg via ORAL

## 2021-02-20 MED ORDER — METHYLPREDNISOLONE SODIUM SUCC 125 MG IJ SOLR
125.0000 mg | Freq: Once | INTRAMUSCULAR | Status: AC
  Administered 2021-02-20: 125 mg via INTRAVENOUS
  Filled 2021-02-20: qty 2

## 2021-02-20 MED ORDER — LABETALOL HCL 5 MG/ML IV SOLN: 10.0000 mg | INTRAVENOUS | Status: AC | PRN

## 2021-02-20 MED ORDER — HEPARIN SODIUM (PORCINE) 1000 UNIT/ML IJ SOLN
INTRAMUSCULAR | Status: DC | PRN
  Administered 2021-02-20 (×2): 4000 [IU] via INTRAVENOUS
  Administered 2021-02-20: 3000 [IU] via INTRAVENOUS

## 2021-02-20 MED ORDER — VERAPAMIL HCL 2.5 MG/ML IV SOLN
INTRAVENOUS | Status: AC
  Filled 2021-02-20: qty 2

## 2021-02-20 MED ORDER — VERAPAMIL HCL 2.5 MG/ML IV SOLN
INTRAVENOUS | Status: DC | PRN
  Administered 2021-02-20: 10 mL via INTRA_ARTERIAL

## 2021-02-20 MED ORDER — MIDAZOLAM HCL 2 MG/2ML IJ SOLN
INTRAMUSCULAR | Status: DC | PRN
  Administered 2021-02-20 (×2): 1 mg via INTRAVENOUS

## 2021-02-20 MED ORDER — TICAGRELOR 90 MG PO TABS
90.0000 mg | ORAL_TABLET | Freq: Two times a day (BID) | ORAL | Status: DC
  Administered 2021-02-20 – 2021-02-21 (×2): 90 mg via ORAL
  Filled 2021-02-20 (×2): qty 1

## 2021-02-20 MED ORDER — MIDAZOLAM HCL 2 MG/2ML IJ SOLN
INTRAMUSCULAR | Status: AC
  Filled 2021-02-20: qty 2

## 2021-02-20 MED ORDER — FENTANYL CITRATE (PF) 100 MCG/2ML IJ SOLN
INTRAMUSCULAR | Status: DC | PRN
  Administered 2021-02-20 (×2): 25 ug via INTRAVENOUS

## 2021-02-20 SURGICAL SUPPLY — 32 items
BALLN SAPPHIRE 1.5X20 (BALLOONS) ×2
BALLN SAPPHIRE 2.0X15 (BALLOONS) ×2
BALLN SAPPHIRE 2.0X20 (BALLOONS) ×2
BALLN SAPPHIRE 2.5X12 (BALLOONS) ×2
BALLN SAPPHIRE ~~LOC~~ 2.5X8 (BALLOONS) ×1 IMPLANT
BALLN SAPPHIRE ~~LOC~~ 3.0X10 (BALLOONS) ×1 IMPLANT
BALLN SAPPHIRE ~~LOC~~ 3.5X8 (BALLOONS) ×1 IMPLANT
BALLN SAPPHIRE ~~LOC~~ 3.75X15 (BALLOONS) ×1 IMPLANT
BALLN SAPPHIRE ~~LOC~~ 4.0X12 (BALLOONS) ×1 IMPLANT
BALLN WOLVERINE 3.25X10 (BALLOONS) ×2
BALLOON SAPPHIRE 1.5X20 (BALLOONS) IMPLANT
BALLOON SAPPHIRE 2.0X15 (BALLOONS) IMPLANT
BALLOON SAPPHIRE 2.0X20 (BALLOONS) IMPLANT
BALLOON SAPPHIRE 2.5X12 (BALLOONS) IMPLANT
BALLOON WOLVERINE 3.25X10 (BALLOONS) IMPLANT
CATH OPTITORQUE TIG 4.0 5F (CATHETERS) ×1 IMPLANT
CATH VISTA GUIDE 6FR JR4 (CATHETERS) ×1 IMPLANT
DEVICE RAD COMP TR BAND LRG (VASCULAR PRODUCTS) ×1 IMPLANT
GLIDESHEATH SLEND SS 6F .021 (SHEATH) ×1 IMPLANT
GUIDEWIRE INQWIRE 1.5J.035X260 (WIRE) IMPLANT
INQWIRE 1.5J .035X260CM (WIRE) ×2
KIT ENCORE 40 (KITS) ×1 IMPLANT
KIT ESSENTIALS PG (KITS) ×1 IMPLANT
KIT HEART LEFT (KITS) ×2 IMPLANT
PACK CARDIAC CATHETERIZATION (CUSTOM PROCEDURE TRAY) ×2 IMPLANT
SHEATH PROBE COVER 6X72 (BAG) ×2 IMPLANT
STENT RESOLUTE ONYX 3.0X15 (Permanent Stent) ×1 IMPLANT
STENT RESOLUTE ONYX 3.5X15 (Permanent Stent) ×1 IMPLANT
TRANSDUCER W/STOPCOCK (MISCELLANEOUS) ×2 IMPLANT
TUBING CIL FLEX 10 FLL-RA (TUBING) ×2 IMPLANT
WIRE ASAHI PROWATER 180CM (WIRE) ×1 IMPLANT
WIRE RUNTHROUGH .014X180CM (WIRE) ×1 IMPLANT

## 2021-02-20 NOTE — Interval H&P Note (Signed)
History and Physical Interval Note:  02/20/2021 11:00 AM  [place]  has presented today for surgery, with the diagnosis of nstemi.  The various methods of treatment have been discussed with the patient and family. After consideration of risks, benefits and other options for treatment, the patient has consented to  Procedure(s): LEFT HEART CATH AND CORONARY ANGIOGRAPHY (N/A)  PERCUTANEOUS CORONARY INTERVENTION  as a surgical intervention.  The patient's history has been reviewed, patient examined, no change in status, stable for surgery.  I have reviewed the patient's chart and labs.  Questions were answered to the patient's satisfaction.    Cath Lab Visit (complete for each Cath Lab visit)  Clinical Evaluation Leading to the Procedure:   ACS: Yes.    Non-ACS:    Anginal Classification: CCS IV  Anti-ischemic medical therapy: Minimal Therapy (1 class of medications)  Non-Invasive Test Results: No non-invasive testing performed  Prior CABG: No previous CABG   Glenetta Hew

## 2021-02-20 NOTE — Progress Notes (Signed)
Pt had bright red stool into toilet, clots noted. Messaged [place], PA, awaiting return call.  Judson Roch Vi Biddinger, RN  02/20/21

## 2021-02-20 NOTE — Progress Notes (Signed)
Removed TR band from right radial. Site is level 0. Applied gauze and Tegaderm to site. Educated pt to leave in place for 24 hrs. Pt verbalized understanding.

## 2021-02-20 NOTE — TOC Benefit Eligibility Note (Signed)
Patient Teacher, English as a foreign language completed.    The patient is currently admitted and upon discharge could be taking Brilinta 90 mg.  The current 30 day co-pay is, $9.85.   The patient is insured through [place], [place] Patient Advocate Specialist [place] Team Direct Number: [phone]  Fax: [phone]

## 2021-02-20 NOTE — Progress Notes (Signed)
[place] for Heparin Indication: chest pain/ACS  Allergies  Allergen Reactions  . Gadolinium Derivatives Hives  . Multihance [Gadobenate] Hives    [name]  . Iodine Rash    Patient Measurements: Height: 5\' 11"  (180.3 cm) Weight: 76 kg (167 lb 8.8 oz) IBW/kg (Calculated) : 75.3 Heparin Dosing Weight: 81.6 kg  Vital Signs: Temp: 97.7 F (36.5 C) (03/25 0004) Temp Source: Oral (03/25 0004) BP: 144/86 (03/25 0004) Pulse Rate: 61 (03/25 0004)  Labs: Recent Labs    02/19/21 1434 02/19/21 1634 02/19/21 1933 02/20/21 0240  HGB 14.8  --   --  13.8  HCT 45.1  --   --  39.4  PLT 170  --   --  139*  HEPARINUNFRC  --   --   --  0.46  CREATININE 1.27*  --   --  1.08  TROPONINIHS 297* 370* 1,019*  --     Estimated Creatinine Clearance: 57.1 mL/min (by C-G formula based on SCr of 1.08 mg/dL).   Medical History: Past Medical History:  Diagnosis Date  . 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 Hyperlipidemia    with low high-density lipoprotein  . Kidney stone     Assessment: 31 YOM admitted with ACS. No AC PTA. Pharmacy has been consulted to dose heparin. CBC WNL.  3/25 AM update:  Heparin level therapeutic  Likely cath today  Goal of Therapy:  Heparin level 0.3-0.7 units/ml Monitor platelets by anticoagulation protocol: Yes   Plan:  Cont heparin at 1200 units/hr 1000 heparin level  Narda Bonds, PharmD, BCPS Clinical Pharmacist Phone: [phone]

## 2021-02-20 NOTE — H&P (View-Only) (Signed)
Progress Note  Patient Name: [name] Date of Encounter: 02/20/2021  [facility] Cardiologist: Lauree Chandler, MD   Subjective   Currently pain-free.  Awaiting cardiac catheterization.  Troponin increased to 1000.  Inpatient Medications    Scheduled Meds: . aspirin EC  81 mg Oral Daily  . atorvastatin  40 mg Oral Daily  . diphenhydrAMINE  25 mg Intravenous Once  . dorzolamide-timolol  1 drop Both Eyes Daily  . latanoprost  1 drop Both Eyes Daily  . lisinopril  5 mg Oral Daily  . methylPREDNISolone sodium succinate  125 mg Intravenous Once  . pantoprazole  40 mg Oral Daily  . sodium chloride flush  3 mL Intravenous Q12H   Continuous Infusions: . sodium chloride    . sodium chloride 1 mL/kg/hr (02/20/21 0263)  . heparin 1,200 Units/hr (02/20/21 0552)   PRN Meds: sodium chloride, acetaminophen, LORazepam, nitroGLYCERIN, ondansetron (ZOFRAN) IV, sodium chloride flush   Vital Signs    Vitals:   02/19/21 1922 02/19/21 2005 02/20/21 0004 02/20/21 0519  BP: 129/89 127/78 (!) 144/86 103/77  Pulse: 62 64 61 60  Resp: 19 19 18 18   Temp:  98.4 F (36.9 C) 97.7 F (36.5 C) 98.4 F (36.9 C)  TempSrc:  Oral Oral Oral  SpO2: 97% 96% 99% 98%  Weight:  76 kg  74.4 kg  Height:  5\' 11"  (1.803 m)      Intake/Output Summary (Last 24 hours) at 02/20/2021 0941 Last data filed at 02/20/2021 0552 Gross per 24 hour  Intake 184.79 ml  Output 150 ml  Net 34.79 ml   Last 3 Weights 02/20/2021 02/19/2021 02/19/2021  Weight (lbs) 164 lb 1.6 oz 167 lb 8.8 oz 180 lb  Weight (kg) 74.435 kg 76 kg 81.647 kg      Telemetry    No adverse arrhythmias- Personally Reviewed  ECG    Sinus rhythm- Personally Reviewed  Physical Exam   GEN: No acute distress.   Neck: No JVD Cardiac: RRR, no murmurs, rubs, or gallops.  Respiratory: Clear to auscultation bilaterally. GI: Soft, nontender, non-distended  MS: No edema; No deformity. Neuro:  Nonfocal  Psych: Normal affect    Labs    High Sensitivity Troponin:   Recent Labs  Lab 02/19/21 1434 02/19/21 1634 02/19/21 1933  TROPONINIHS 297* 370* 1,019*      Chemistry Recent Labs  Lab 02/19/21 1434 02/20/21 0240  NA 138 138  K 4.6 3.9  CL 105 105  CO2 27 25  GLUCOSE 104* 113*  BUN 18 19  CREATININE 1.27* 1.08  CALCIUM 9.2 9.0  GFRNONAA 57* >60  ANIONGAP 6 8     Hematology Recent Labs  Lab 02/19/21 1434 02/20/21 0240  WBC 5.7 5.7  RBC 4.78 4.32  HGB 14.8 13.8  HCT 45.1 39.4  MCV 94.4 91.2  MCH 31.0 31.9  MCHC 32.8 35.0  RDW 12.4 12.7  PLT 170 139*    BNPNo results for input(s): BNP, PROBNP in the last 168 hours.   DDimer No results for input(s): DDIMER in the last 168 hours.   Radiology    DG Chest 2 View  Result Date: 02/19/2021 CLINICAL DATA:  Chest pain EXAM: CHEST - 2 VIEW COMPARISON:  None. FINDINGS: The heart size and mediastinal contours are within normal limits. Both lungs are clear. The visualized skeletal structures are unremarkable. IMPRESSION: No active cardiopulmonary disease. Electronically Signed   By: Inez Catalina M.D.   On: 02/19/2021 15:15    Cardiac  Studies   Echo pending, cardiac catheterization pending  Patient Profile     80 y.o. male with non-ST elevation myocardial infarction.  Prior RCA stents both in 1999 as well as 2009, overlapping x2 RCA DES  Assessment & Plan    -Plan for cardiac catheterization, tentatively around noon.  He is n.p.o.  Discussed with nursing team. -IV heparin -High intensity statin, atorvastatin 40 -Dual antiplatelet therapy when applicable. -Await echocardiogram, currently being performed. -Hydration, creatinine 1.27 on admission.  On lisinopril 5 mg. -Cardiac rehab when able.   For questions or updates, please contact  Please consult [email or web address] for contact info under        Signed, Candee Furbish, MD  02/20/2021, 9:41 AM

## 2021-02-20 NOTE — Progress Notes (Signed)
Progress Note  Patient Name: [name] Date of Encounter: 02/20/2021  [facility] Cardiologist: Lauree Chandler, MD   Subjective   Currently pain-free.  Awaiting cardiac catheterization.  Troponin increased to 1000.  Inpatient Medications    Scheduled Meds: . aspirin EC  81 mg Oral Daily  . atorvastatin  40 mg Oral Daily  . diphenhydrAMINE  25 mg Intravenous Once  . dorzolamide-timolol  1 drop Both Eyes Daily  . latanoprost  1 drop Both Eyes Daily  . lisinopril  5 mg Oral Daily  . methylPREDNISolone sodium succinate  125 mg Intravenous Once  . pantoprazole  40 mg Oral Daily  . sodium chloride flush  3 mL Intravenous Q12H   Continuous Infusions: . sodium chloride    . sodium chloride 1 mL/kg/hr (02/20/21 7654)  . heparin 1,200 Units/hr (02/20/21 0552)   PRN Meds: sodium chloride, acetaminophen, LORazepam, nitroGLYCERIN, ondansetron (ZOFRAN) IV, sodium chloride flush   Vital Signs    Vitals:   02/19/21 1922 02/19/21 2005 02/20/21 0004 02/20/21 0519  BP: 129/89 127/78 (!) 144/86 103/77  Pulse: 62 64 61 60  Resp: 19 19 18 18   Temp:  98.4 F (36.9 C) 97.7 F (36.5 C) 98.4 F (36.9 C)  TempSrc:  Oral Oral Oral  SpO2: 97% 96% 99% 98%  Weight:  76 kg  74.4 kg  Height:  5\' 11"  (1.803 m)      Intake/Output Summary (Last 24 hours) at 02/20/2021 0941 Last data filed at 02/20/2021 0552 Gross per 24 hour  Intake 184.79 ml  Output 150 ml  Net 34.79 ml   Last 3 Weights 02/20/2021 02/19/2021 02/19/2021  Weight (lbs) 164 lb 1.6 oz 167 lb 8.8 oz 180 lb  Weight (kg) 74.435 kg 76 kg 81.647 kg      Telemetry    No adverse arrhythmias- Personally Reviewed  ECG    Sinus rhythm- Personally Reviewed  Physical Exam   GEN: No acute distress.   Neck: No JVD Cardiac: RRR, no murmurs, rubs, or gallops.  Respiratory: Clear to auscultation bilaterally. GI: Soft, nontender, non-distended  MS: No edema; No deformity. Neuro:  Nonfocal  Psych: Normal affect    Labs    High Sensitivity Troponin:   Recent Labs  Lab 02/19/21 1434 02/19/21 1634 02/19/21 1933  TROPONINIHS 297* 370* 1,019*      Chemistry Recent Labs  Lab 02/19/21 1434 02/20/21 0240  NA 138 138  K 4.6 3.9  CL 105 105  CO2 27 25  GLUCOSE 104* 113*  BUN 18 19  CREATININE 1.27* 1.08  CALCIUM 9.2 9.0  GFRNONAA 57* >60  ANIONGAP 6 8     Hematology Recent Labs  Lab 02/19/21 1434 02/20/21 0240  WBC 5.7 5.7  RBC 4.78 4.32  HGB 14.8 13.8  HCT 45.1 39.4  MCV 94.4 91.2  MCH 31.0 31.9  MCHC 32.8 35.0  RDW 12.4 12.7  PLT 170 139*    BNPNo results for input(s): BNP, PROBNP in the last 168 hours.   DDimer No results for input(s): DDIMER in the last 168 hours.   Radiology    DG Chest 2 View  Result Date: 02/19/2021 CLINICAL DATA:  Chest pain EXAM: CHEST - 2 VIEW COMPARISON:  None. FINDINGS: The heart size and mediastinal contours are within normal limits. Both lungs are clear. The visualized skeletal structures are unremarkable. IMPRESSION: No active cardiopulmonary disease. Electronically Signed   By: Inez Catalina M.D.   On: 02/19/2021 15:15    Cardiac  Studies   Echo pending, cardiac catheterization pending  Patient Profile     80 y.o. male with non-ST elevation myocardial infarction.  Prior RCA stents both in 1999 as well as 2009, overlapping x2 RCA DES  Assessment & Plan    -Plan for cardiac catheterization, tentatively around noon.  He is n.p.o.  Discussed with nursing team. -IV heparin -High intensity statin, atorvastatin 40 -Dual antiplatelet therapy when applicable. -Await echocardiogram, currently being performed. -Hydration, creatinine 1.27 on admission.  On lisinopril 5 mg. -Cardiac rehab when able.   For questions or updates, please contact [place] Please consult [email or web address] for contact info under        Signed, Candee Furbish, MD  02/20/2021, 9:41 AM

## 2021-02-20 NOTE — Brief Op Note (Signed)
BRIEF CARDIAC CATHETERIZATION-PCI NOTE:  02/20/2021  1:30 PM   Primary Care Provider: Azell Der, MD [facility] Cardiologist: Lauree Chandler, MD  Referring Cardiologist: Dr. Candee Furbish  PATIENT:  [name]  80 y.o. male with history of prior BMS stent to the RCA in the late 1990s.  He came in with an inferior STEMI in 2009 with an occluded stent in the RCA this was treated with 2 overlapping Promus DES stents 3.5 mm 23 mm based on IVUS guidance.  He is done relatively well, but presented to [facility] on 02/19/2021 with a non-ST elevation MI.  He is referred for cardiac catheterization and possible PCI.  PRE-OPERATIVE DIAGNOSIS:  nstemi  POST-OPERATIVE DIAGNOSIS:   Severe single-vessel CAD of the RCA: 75% ostial RCA followed by long stented segment-there is 99% ISR at the original stent overlap site followed by 70% stenosis within the stent, there is a 95% stenosis just distal to the stent.  Successful DES PCI of the distal stented segment crossing the 95% stenosis with a Resolute Onyx  DES 3.0 mm 15 mm postdilated to 3.3 mm distally and 3.8 mm proximal at the overlap.  Successful DES PCI of the ostial RCA overlapping the more proximal DES stent with a Resolute Onyx DES 3.5 mm x 15 mm postdilated to 4.0 mm with 4.1 mm the ostium.   Borderline successful PTCA of the significant in-stent restenosis in the overlapping stent segment.  Despite the use of a 3.25 mm  scoring balloon and high-pressure PTCA using a 3.75 mm post elation balloon, we are only able to reduce the in-stent restenosis to 50%.  Mild 30% proximal D1 stenosis with minimal disease in the LAD and LCx system otherwise.  Well-preserved LVEF, with normal LVEDP.  PROCEDURE:  Procedure(s): LEFT HEART CATH AND CORONARY ANGIOGRAPHY (N/A) CORONARY STENT INTERVENTION (N/A)  SURGEON:  Surgeon(s) and Role:    * Leonie Man, MD - Primary  ASSISTANTS: Images reviewed with Dr. Angelena Form (the  patient's primary cardiologist)  Time Out: Verified patient identification, verified procedure, site/side was marked, verified correct patient position, special equipment/implants available, medications/allergies/relevent history reviewed, required imaging and test results available. Performed.  Access:  . RIGHT Radial Artery: 6 Fr sheath -- Seldinger technique using Micropuncture Kit -- Direct ultrasound guidance used.  Permanent image obtained and placed on chart. -- 10 mL radial cocktail IA; 4000 Units IV Heparin  Left Heart Catheterization: 5 Fr Catheters advanced or exchanged over a J-wire under direct fluoroscopic guidance into the ascending aorta; TIG 4.0 catheter advanced first.  . * LV Hemodynamics (LV Gram): TIG 4.0 Catheter . * Left Right Coronary Artery Cineangiography: TIG 4.0 Catheter    Review of initial angiography revealed: Minimal disease in the LCA system, with culprit lesions being in the RCA- 75% ostial RCA followed by long stented segment-there is 99% ISR at the original stent overlap site followed by 70% stenosis within the stent, there is a 95% stenosis just distal to the stent.  Preparations are made for PCI of the RCA Multaq segments. --> Additional bolus of IV heparin administered along with oral Brilinta 710 mg  Complicated/difficult PCI/PTCA of several segments in the RCA performed: See FINDINGS: -> Complex procedure based on the use of buddy wire, multiple different balloon-starting off with a 1.5 mm balloon to Cross the culprit lesion that upsizing gradually from 1.5--2.0--2.5 followed by 3.25 mm scoring balloon and 3.5 mm [place] balloon followed by 3.75 mm post-dilation [place] balloon.  2  stents were placed proximal and distal, and PTCA performed throughout the stented segment.  Upon completion of Angiogaphy, the catheter was removed completely out of the body over a wire, without complication.  Radial sheath removed in the Cardiac Catheterization lab with TR Band  placed for hemostasis.  TR Band: 1320  Hours; 13 mL air  MEDICATIONS . SQ Lidocaine 2 mL . Radial Cocktail: 3 mg Verapmil in 10 mL NS . Heparin: Total 11,000 units . IC NTG 200 mcg x 3 . Brilinta 180 mg p.o.   EBL:  <46m  COUNTS:  YES  DICTATION: .Dragon Dictation  PLAN OF CARE: Return to nursing unit for ongoing care, TRAM removal.  DAPT times minimum 1 year -> optimization of medical management.  PATIENT DISPOSITION:  PACU - hemodynamically stable.   Delay start of Pharmacological VTE agent (>24hrs) due to surgical blood loss or risk of bleeding: not applicable   DGlenetta Hew MD

## 2021-02-20 NOTE — Progress Notes (Signed)
  Echocardiogram 2D Echocardiogram has been performed.  [name] 02/20/2021, 10:06 AM

## 2021-02-20 NOTE — Progress Notes (Signed)
Nurse reported "Pt had bright red stool into toilet, clots noted". No prior hx per nurse. Advised to continue to monitor. Update provider if recurrent episode. CBC in AM.

## 2021-02-21 DIAGNOSIS — I10 Essential (primary) hypertension: Secondary | ICD-10-CM

## 2021-02-21 LAB — CBC
HCT: 39.2 % (ref 39.0–52.0)
Hemoglobin: 13.3 g/dL (ref 13.0–17.0)
MCH: 31.1 pg (ref 26.0–34.0)
MCHC: 33.9 g/dL (ref 30.0–36.0)
MCV: 91.6 fL (ref 80.0–100.0)
Platelets: 155 10*3/uL (ref 150–400)
RBC: 4.28 MIL/uL (ref 4.22–5.81)
RDW: 12.5 % (ref 11.5–15.5)
WBC: 6.8 10*3/uL (ref 4.0–10.5)
nRBC: 0 % (ref 0.0–0.2)

## 2021-02-21 LAB — BASIC METABOLIC PANEL
Anion gap: 7 (ref 5–15)
BUN: 20 mg/dL (ref 8–23)
CO2: 22 mmol/L (ref 22–32)
Calcium: 8.8 mg/dL — ABNORMAL LOW (ref 8.9–10.3)
Chloride: 106 mmol/L (ref 98–111)
Creatinine, Ser: 1.16 mg/dL (ref 0.61–1.24)
GFR, Estimated: >60 mL/min (ref >60)
Glucose, Bld: 140 mg/dL — ABNORMAL HIGH (ref 70–99)
Potassium: 4 mmol/L (ref 3.5–5.1)
Sodium: 135 mmol/L (ref 135–145)

## 2021-02-21 MED ORDER — PANTOPRAZOLE SODIUM 40 MG PO TBEC: 40.0000 mg | DELAYED_RELEASE_TABLET | Freq: Every day | ORAL | 0 refills | Status: DC

## 2021-02-21 MED ORDER — TICAGRELOR 90 MG PO TABS: 90.0000 mg | ORAL_TABLET | Freq: Two times a day (BID) | ORAL | 2 refills | Status: DC

## 2021-02-21 MED ORDER — ATORVASTATIN CALCIUM 80 MG PO TABS: 80.0000 mg | ORAL_TABLET | Freq: Every day | ORAL | 0 refills | Status: DC

## 2021-02-21 MED ORDER — NITROGLYCERIN 0.4 MG SL SUBL: 0.4000 mg | SUBLINGUAL_TABLET | SUBLINGUAL | 2 refills | Status: AC | PRN

## 2021-02-21 MED ORDER — TICAGRELOR 90 MG PO TABS: 90.0000 mg | ORAL_TABLET | Freq: Two times a day (BID) | ORAL | 0 refills | Status: DC

## 2021-02-21 MED ORDER — ATORVASTATIN CALCIUM 80 MG PO TABS: 80.0000 mg | ORAL_TABLET | Freq: Every day | ORAL | 2 refills | Status: DC

## 2021-02-21 NOTE — Progress Notes (Signed)
Progress Note  Patient Name: [name] Date of Encounter: 02/21/2021  [name] Cardiologist: Lauree Chandler, MD   Subjective   No CP or dyspnea  Inpatient Medications    Scheduled Meds: . aspirin EC  81 mg Oral Daily  . atorvastatin  40 mg Oral Daily  . dorzolamide-timolol  1 drop Both Eyes Daily  . latanoprost  1 drop Both Eyes Daily  . lisinopril  5 mg Oral Daily  . pantoprazole  40 mg Oral Daily  . sodium chloride flush  3 mL Intravenous Q12H  . ticagrelor  90 mg Oral BID   Continuous Infusions: . sodium chloride     PRN Meds: sodium chloride, acetaminophen, LORazepam, nitroGLYCERIN, ondansetron (ZOFRAN) IV, sodium chloride flush   Vital Signs    Vitals:   02/20/21 1324 02/20/21 1950 02/20/21 2335 02/21/21 0240  BP: 122/70 96/64 104/68 134/86  Pulse: 63 70 (!) 57 64  Resp: 17 15 16 15   Temp:  98.1 F (36.7 C) 97.7 F (36.5 C) 97.8 F (36.6 C)  TempSrc:  Oral Oral Oral  SpO2: 99% 98% 97% 98%  Weight:    75.6 kg  Height:        Intake/Output Summary (Last 24 hours) at 02/21/2021 0610 Last data filed at 02/21/2021 0200 Gross per 24 hour  Intake 1018.37 ml  Output 320 ml  Net 698.37 ml   Last 3 Weights 02/21/2021 02/20/2021 02/19/2021  Weight (lbs) 166 lb 11.2 oz 164 lb 1.6 oz 167 lb 8.8 oz  Weight (kg) 75.615 kg 74.435 kg 76 kg      Telemetry    Sinus - Personally Reviewed  Physical Exam   GEN: No acute distress.   Neck: No JVD Cardiac: RRR, no murmurs, rubs, or gallops.  Respiratory: Clear to auscultation bilaterally. GI: Soft, nontender, non-distended  MS: No edema; radial cath site with no hematoma Neuro:  Nonfocal  Psych: Normal affect   Labs    High Sensitivity Troponin:   Recent Labs  Lab 02/19/21 1434 02/19/21 1634 02/19/21 1933  TROPONINIHS 297* 370* 1,019*      Chemistry Recent Labs  Lab 02/19/21 1434 02/20/21 0240 02/21/21 0105  NA 138 138 135  K 4.6 3.9 4.0  CL 105 105 106  CO2 27 25 22   GLUCOSE  104* 113* 140*  BUN 18 19 20   CREATININE 1.27* 1.08 1.16  CALCIUM 9.2 9.0 8.8*  GFRNONAA 57* >60 >60  ANIONGAP 6 8 7      Hematology Recent Labs  Lab 02/19/21 1434 02/20/21 0240 02/21/21 0105  WBC 5.7 5.7 6.8  RBC 4.78 4.32 4.28  HGB 14.8 13.8 13.3  HCT 45.1 39.4 39.2  MCV 94.4 91.2 91.6  MCH 31.0 31.9 31.1  MCHC 32.8 35.0 33.9  RDW 12.4 12.7 12.5  PLT 170 139* 155    Radiology    DG Chest 2 View  Result Date: 02/19/2021 CLINICAL DATA:  Chest pain EXAM: CHEST - 2 VIEW COMPARISON:  None. FINDINGS: The heart size and mediastinal contours are within normal limits. Both lungs are clear. The visualized skeletal structures are unremarkable. IMPRESSION: No active cardiopulmonary disease. Electronically Signed   By: Inez Catalina M.D.   On: 02/19/2021 15:15   CARDIAC CATHETERIZATION  Result Date: 02/20/2021  The left ventricular systolic function is normal. The left ventricular ejection fraction is 55-65% by visual estimate.  LV end diastolic pressure is normal. There is no aortic valve stenosis.  1st Diag lesion is 35% stenosed -otherwise  no significant disease noted in the LCA system.  ---------------------------------------------  Lesion #1: Ost RCA lesion is 75% stenosed -not previously stented  A drug-eluting stent was successfully placed overlapping the more proximal of the original Drug-Eluting Stents (Promus DES 3.5 mm 23 mm -> postdilated to 3.75 millimeters), using a STENT RESOLUTE ONYX 3.5X15. -> Postdilated to 4.1 mm at both the overlap and in the ostium.  Post intervention, there is a 0% residual stenosis of the lesion in the overlap.  ---------------------------------------------  Lesion #2: Prox RCA to Mid RCA is 99% stenosed -> at the initial overlap of the Drug-Eluting Stents within the original BMS..  Scoring balloon angioplasty was performed using a BALLOON WOLVERINE 3.25X10 followed by post dilation with a 3.75 mm [place] balloon  Post intervention, there is a 50%  residual stenosis (in-stent restenosis).  ---------------------------------------------  Lesion #3: mid RCA-1 lesion is 70% stenosed -> in the more distal of the original DES stents (Promus 3.5 mm x 23 mm -originally postdilated to 3.75 mm)  Scoring balloon angioplasty was performed using a BALLOON WOLVERINE 3.25X10 followed by post dilation with a 3.75 mm [place] balloon  Post intervention, there is a 30% residual stenosis (in-stent restenosis).  ---------------------------------------------  Lesion #4: Mid RCA-2 lesion is 95% stenosed -> the distal edge of the second DES stent.  A drug-eluting stent was successfully placed (after extensive PTCA of this lesion starting with a 1.5 mm stepwise to 2.5 mm balloon) using a STENT RESOLUTE ONYX 3.0X15. Postdilated distally to 3.0 mm and proximally to 3.6 mm  Post intervention, there is a 0% residual stenosis of the lesion in the overlap  SUMMARY  Severe single-vessel CAD of the RCA: 75% ostial RCA followed by long stented segment-there is 99% ISR at the original stent overlap site followed by 70% stenosis within the stent, there is a 95% stenosis just distal to the stent. ? Successful DES PCI of the distal stented segment crossing the 95% stenosis with a Resolute Onyx  DES 3.0 mm 15 mm postdilated to 3.3 mm distally and 3.8 mm proximal at the overlap. ? Successful DES PCI of the ostial RCA overlapping the more proximal DES stent (95% with a Resolute Onyx DES 3.5 mm x 15 mm postdilated to 4.0 mm with 4.1 mm the ostium. ? Borderline successful PTCA of the significant in-stent restenosis (99%) in the overlapping stent segment.  Despite the use of a 3.25 mm  scoring balloon and high-pressure PTCA using a 3.75 mm post elation balloon, we are only able to reduce the in-stent restenosis to 50%. ? TIMI-3 flow pre and post.  Mild 30% proximal D1 stenosis with minimal disease in the LAD and LCx system otherwise.  Well-preserved LVEF, with normal LVEDP. RECOMMENDATIONS   Return if needed for ongoing care.  Monitor for return of symptoms.  Continue aggressive discharge medication Glenetta Hew, MD  ECHOCARDIOGRAM COMPLETE  Result Date: 02/20/2021    ECHOCARDIOGRAM REPORT   Patient Name:   [name] Date of Exam: 02/20/2021 Medical Rec #:  [id number]        Height:       71.0 in Accession #:    [id number]       Weight:       164.1 lb Date of Birth:  January 29, 1941        BSA:          1.939 m Patient Age:    4 years         BP:  103/77 mmHg Patient Gender: M                HR:           60 bpm. Exam Location:  Inpatient Procedure: 2D Echo, Cardiac Doppler and Color Doppler Indications:    NSTEMI  History:        Patient has prior history of Echocardiogram examinations, most                 recent 02/07/2020. CAD and Previous Myocardial Infarction; Risk                 Factors:Dyslipidemia.  Sonographer:    Clayton Lefort RDCS (AE) Referring Phys: [id number] Darreld Mclean  Sonographer Comments: Image acquisition challenging due to respiratory motion. IMPRESSIONS  1. Left ventricular ejection fraction, by estimation, is 55 to 60%. The left ventricle has normal function. The left ventricle demonstrates regional wall motion abnormalities (see scoring diagram/findings for description). Left ventricular diastolic parameters are consistent with Grade I diastolic dysfunction (impaired relaxation). There is mild hypokinesis of the left ventricular, basal inferior wall and inferolateral wall.  2. Right ventricular systolic function is normal. The right ventricular size is normal. There is normal pulmonary artery systolic pressure. The estimated right ventricular systolic pressure is 28.3 mmHg.  3. The mitral valve is normal in structure. Trivial mitral valve regurgitation. No evidence of mitral stenosis.  4. The aortic valve is normal in structure. Aortic valve regurgitation is not visualized. No aortic stenosis is present.  5. The inferior vena cava is normal in size with greater  than 50% respiratory variability, suggesting right atrial pressure of 3 mmHg. Comparison(s): Prior images reviewed side by side. The left ventricular function is unchanged. The left ventricular wall motion abnormality is worse. FINDINGS  Left Ventricle: Left ventricular ejection fraction, by estimation, is 55 to 60%. The left ventricle has normal function. The left ventricle demonstrates regional wall motion abnormalities. Mild hypokinesis of the left ventricular, basal inferior wall and inferolateral wall. The left ventricular internal cavity size was normal in size. There is no left ventricular hypertrophy. Left ventricular diastolic parameters are consistent with Grade I diastolic dysfunction (impaired relaxation). Normal left ventricular filling pressure. Right Ventricle: The right ventricular size is normal. No increase in right ventricular wall thickness. Right ventricular systolic function is normal. There is normal pulmonary artery systolic pressure. The tricuspid regurgitant velocity is 2.27 m/s, and  with an assumed right atrial pressure of 3 mmHg, the estimated right ventricular systolic pressure is 15.1 mmHg. Left Atrium: Left atrial size was normal in size. Right Atrium: Right atrial size was normal in size. Pericardium: There is no evidence of pericardial effusion. Mitral Valve: The mitral valve is normal in structure. Trivial mitral valve regurgitation. No evidence of mitral valve stenosis. Tricuspid Valve: The tricuspid valve is normal in structure. Tricuspid valve regurgitation is trivial. No evidence of tricuspid stenosis. Aortic Valve: The aortic valve is normal in structure. Aortic valve regurgitation is not visualized. No aortic stenosis is present. Aortic valve mean gradient measures 2.0 mmHg. Aortic valve peak gradient measures 3.6 mmHg. Aortic valve area, by VTI measures 2.39 cm. Pulmonic Valve: The pulmonic valve was normal in structure. Pulmonic valve regurgitation is not visualized. No  evidence of pulmonic stenosis. Aorta: The aortic root is normal in size and structure. Venous: The inferior vena cava is normal in size with greater than 50% respiratory variability, suggesting right atrial pressure of 3 mmHg. IAS/Shunts: No atrial level shunt detected by  color flow Doppler.  LEFT VENTRICLE PLAX 2D LVIDd:         4.70 cm  Diastology LVIDs:         3.20 cm  LV e' medial:    6.09 cm/s LV PW:         1.30 cm  LV E/e' medial:  11.4 LV IVS:        1.00 cm  LV e' lateral:   8.59 cm/s LVOT diam:     2.10 cm  LV E/e' lateral: 8.1 LV SV:         54 LV SV Index:   28 LVOT Area:     3.46 cm  RIGHT VENTRICLE             IVC RV Basal diam:  3.10 cm     IVC diam: 1.10 cm RV S prime:     15.00 cm/s TAPSE (M-mode): 1.7 cm LEFT ATRIUM           Index       RIGHT ATRIUM           Index LA diam:      3.00 cm 1.55 cm/m  RA Area:     14.50 cm LA Vol (A2C): 46.0 ml 23.73 ml/m RA Volume:   33.10 ml  17.07 ml/m LA Vol (A4C): 26.2 ml 13.52 ml/m  AORTIC VALVE AV Area (Vmax):    2.52 cm AV Area (Vmean):   2.24 cm AV Area (VTI):     2.39 cm AV Vmax:           95.30 cm/s AV Vmean:          71.100 cm/s AV VTI:            0.226 m AV Peak Grad:      3.6 mmHg AV Mean Grad:      2.0 mmHg LVOT Vmax:         69.20 cm/s LVOT Vmean:        46.000 cm/s LVOT VTI:          0.156 m LVOT/AV VTI ratio: 0.69  AORTA Ao Asc diam: 3.40 cm MITRAL VALVE               TRICUSPID VALVE MV Area (PHT): 2.39 cm    TR Peak grad:   20.6 mmHg MV Decel Time: 317 msec    TR Vmax:        227.00 cm/s MV E velocity: 69.40 cm/s MV A velocity: 90.40 cm/s  SHUNTS MV E/A ratio:  0.77        Systemic VTI:  0.16 m                            Systemic Diam: 2.10 cm Dani Gobble Croitoru MD Electronically signed by Sanda Klein MD Signature Date/Time: 02/20/2021/12:38:07 PM    Final     Patient Profile     80 y.o. male admitted with non-ST elevation myocardial infarction.  Echocardiogram showed normal LV function, grade 1 diastolic dysfunction, hypokinesis of  the basal inferior wall.  Cardiac catheterization revealed normal LV function, normal left ventricular end-diastolic pressure, 31% ostial right coronary artery followed by 99% in-stent restenosis in the right coronary artery followed by 70% stenosis followed by 95% stenosis.  Patient had PCI of the right coronary artery.  Assessment & Plan    1 non-ST elevation myocardial infarction-patient doing well this morning with no chest pain.  He is status post PCI of the right coronary artery.  Plan to continue aspirin, Brilinta and statin.  Metoprolol was not added due to bradycardia at time of admission.  Note LV function is preserved.  2 hypertension-patient's blood pressure is controlled.  We will continue present medications and follow.  3 hyperlipidemia-increase Lipitor to 80 mg daily.  Check lipids and liver in 12 weeks.  4 hematochezia-patient had mild hematochezia last evening by his report.  He has had no problems since that time.  Hemoglobin is unchanged.  I have asked him to follow-up with his primary care physician for this following discharge and he may need elective colonoscopy.  Plan discharge today.  Follow-up with APP in approximately 2 weeks.  Follow-up Dr. Angelena Form in 3 months.  Follow-up with primary care for hematochezia as outlined above. Greater than 30 minutes PA and physician time. D2  For questions or updates, please contact [place] Please consult [email or web address] for contact info under        Signed, Kirk Ruths, MD  02/21/2021, 6:10 AM

## 2021-02-21 NOTE — Discharge Summary (Signed)
Discharge Summary    Patient ID: [name] MRN: [id number]; DOB: 1941-02-19  Admit date: 02/19/2021 Discharge date: 02/21/2021  PCP:  Azell Der, MD   [place]  Cardiologist:  Lauree Chandler, MD  Advanced Practice Provider:  No care team member to display Electrophysiologist:  None   Discharge Diagnoses    Principal Problem:   NSTEMI (non-ST elevated myocardial infarction) [facility]) Active Problems:   Hyperlipidemia   Coronary artery disease involving native coronary artery of native heart with unstable angina pectoris ([place])   Presence of drug coated stent in right coronary artery   Hypertension    Diagnostic Studies/Procedures    Cath: 02/20/21   The left ventricular systolic function is normal. The left ventricular ejection fraction is 55-65% by visual estimate.  LV end diastolic pressure is normal. There is no aortic valve stenosis.  1st Diag lesion is 35% stenosed -otherwise no significant disease noted in the LCA system.  ---------------------------------------------  Lesion #1: Ost RCA lesion is 75% stenosed -not previously stented  A drug-eluting stent was successfully placed overlapping the more proximal of the original Drug-Eluting Stents (Promus DES 3.5 mm 23 mm -> postdilated to 3.75 millimeters), using a STENT RESOLUTE ONYX 3.5X15. -> Postdilated to 4.1 mm at both the overlap and in the ostium.  Post intervention, there is a 0% residual stenosis of the lesion in the overlap.  ---------------------------------------------  Lesion #2: Prox RCA to Mid RCA is 99% stenosed -> at the initial overlap of the Drug-Eluting Stents within the original BMS..  Scoring balloon angioplasty was performed using a BALLOON WOLVERINE 3.25X10 followed by post dilation with a 3.75 mm [place] balloon  Post intervention, there is a 50% residual stenosis (in-stent restenosis).  ---------------------------------------------  Lesion #3: mid  RCA-1 lesion is 70% stenosed -> in the more distal of the original DES stents (Promus 3.5 mm x 23 mm -originally postdilated to 3.75 mm)  Scoring balloon angioplasty was performed using a BALLOON WOLVERINE 3.25X10 followed by post dilation with a 3.75 mm [place] balloon  Post intervention, there is a 30% residual stenosis (in-stent restenosis).  ---------------------------------------------  Lesion #4: Mid RCA-2 lesion is 95% stenosed -> the distal edge of the second DES stent.  A drug-eluting stent was successfully placed (after extensive PTCA of this lesion starting with a 1.5 mm stepwise to 2.5 mm balloon) using a STENT RESOLUTE ONYX 3.0X15. Postdilated distally to 3.0 mm and proximally to 3.6 mm  Post intervention, there is a 0% residual stenosis of the lesion in the overlap   SUMMARY  Severe single-vessel CAD of the RCA: 75% ostial RCA followed by long stented segment-there is 99% ISR at the original stent overlap site followed by 70% stenosis within the stent, there is a 95% stenosis just distal to the stent. ? Successful DES PCI of the distal stented segment crossing the 95% stenosis with a Resolute Onyx DES 3.0 mm 15 mm postdilated to 3.74mm distally and 3.8 mm proximal at the overlap. ? Successful DES PCI of the ostial RCA overlapping the more proximal DES stent (95% with a Resolute Onyx DES 3.5 mm x 15 mm postdilated to 4.0 mm with 4.1 mm the ostium.  ? Borderline successful PTCA of the significant in-stent restenosis (99%) in the overlapping stent segment. Despite the use of a 3.25 mm scoring balloon and high-pressure PTCA using a 3.75 mm post elation balloon, we are only able to reduce the in-stent restenosis to 50%. ? TIMI-3 flow pre and  post.  Mild 30% proximal D1 stenosis with minimal disease in the LAD and LCx system otherwise.  Well-preserved LVEF, with normal LVEDP.   RECOMMENDATIONS  Return if needed for ongoing care.  Monitor for return of symptoms.  Continue  aggressive discharge medication  Antiplatelet recommendation: Recommend uninterrupted dual antiplatelet therapy with Aspirin 81mg  daily and Ticagrelor 90mg  twice daily for a minimum of 12 months (ACS-Class I recommendation). Would be reasonable to stop aspirin after 6 months.  Continue ticagrelor as tolerated for entire year at initial treatment dose 9 mg twice daily, then reduce to maintenance dose of 60 mg twice daily to complete a second year without aspirin. Patient has extensive stents now in the RCA with severe in-stent restenosis reduced only from 95% to 50%.  Glenetta Hew, MD  Diagnostic Dominance: Right    Intervention    Echo: 02/20/21  IMPRESSIONS    1. Left ventricular ejection fraction, by estimation, is 55 to 60%. The  left ventricle has normal function. The left ventricle demonstrates  regional wall motion abnormalities (see scoring diagram/findings for  description). Left ventricular diastolic  parameters are consistent with Grade I diastolic dysfunction (impaired  relaxation). There is mild hypokinesis of the left ventricular, basal  inferior wall and inferolateral wall.  2. Right ventricular systolic function is normal. The right ventricular  size is normal. There is normal pulmonary artery systolic pressure. The  estimated right ventricular systolic pressure is 56.9 mmHg.  3. The mitral valve is normal in structure. Trivial mitral valve  regurgitation. No evidence of mitral stenosis.  4. The aortic valve is normal in structure. Aortic valve regurgitation is  not visualized. No aortic stenosis is present.  5. The inferior vena cava is normal in size with greater than 50%  respiratory variability, suggesting right atrial pressure of 3 mmHg.   Comparison(s): Prior images reviewed side by side. The left ventricular  function is unchanged. The left ventricular wall motion abnormality is  worse.  _____________   History of Present Illness      [name] is a 80 y.o. 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   Mr. Mavis is followed by Dr. Angelena Form. Patient had done well from a cardiac standpoint over the last several years. He has not had an ischemic evaluation since his MI in 2009. He was last seen by Dr. Angelena Form in 12/2019 at which time he was doing well from a cardiac standpoint. Repeat Echo was ordered to for routine evaluation of mitral regurgitation. Echo showed LVEF of 62% with normal wall motion, grade 1 diastolic dysfunction, and mild MR.  Patient presented to the ED on 3/24 for further evaluation of chest pain. Patient developed chest pain that morning that felt similar to the pain he had years ago prior to PCI. He had difficult describing the pain and just stated it was "uncomfortable" and "grabbed his attention." He told the ED provider it felt like indigestion and reportedly had some relief with Pepto. He also mentioned that it felt better when he went outside in the "fresh air." Pain lasted for a "pretty good while" so patient asked wife to drive him to the ED. Patient walks 30 minutes twice and day and upon further question it sounded like he sometimes had some chest pain  with this. Patient denied any shortness of breath, palpitations, syncope. He noted occasional lightheadedness/dizziness if he stood to quickly. No recent fevers, body aches, chills, or illnesses. No abnormal bleeding.   In the ED, vitals stable. EKG showed T wave inversions in leads III and AVF and very minimal ones in V6 which are new. Initial high-sensitivity troponin elevated at 297. Repeat pending. Chest x-ray showed no acute findings. WBC 5.7, Hgb 14.8, Plts 170. Na 138, K 4.6, Glucose 104, BUN 18, Cr 1.27. Respiratory panel negative for  COVID and influenza. Patient was started on IV Heparin and given Aspirin and GI cocktail.  Patient denied any history of tobacco use. He was unsure if he has any family history of cardiovascular disease.  Hospital Course   1. NSTEMI: hsTn peaked at 1019. Underwent successful PCI of the RCA with stenting of the ostial RCA with DESx1, along with DESx1 across 95% lesion in the dRCA and borderline successful PTCA of significant ISR of previous stenting of the mRCA. Recommendations for DAPT with ASA/Brilinta for at least one year, then continue with reduced dose Brilinta of 60mg  BID for an additional year without ASA. No post cath site complications noted. Echo showed EF of 55-60% with mild hypokinesis in the LV, basal and inferolateral wall. -- will continue ASA, Brilinta, statin. Unable to add BB therapy in the setting of bradycardia  2. HTN: controlled at present  3. HLD: atorvastatin increased to 80mg  daily -- FLP/LFTs in 3 months  4. Hematochezia: reported mild episode while inpatient, but isolated episode without recurrence. Hgb remained stable, unchanged. Recommendations to follow up with PCP for consideration of elective colonoscopy in the future -- CBC at follow up   Patient was seen by Dr. Stanford Breed and determined stable for discharge. Follow up in the office has been arranged. Medications were sent to the patient preferred pharmacy prior to discharge.   Did the patient have an acute coronary syndrome (MI, NSTEMI, STEMI, etc) this admission?:  Yes                               AHA/ACC Clinical Performance & Quality Measures: 4. Aspirin prescribed? - Yes 5. ADP Receptor Inhibitor (Plavix/Clopidogrel, Brilinta/Ticagrelor or Effient/Prasugrel) prescribed (includes medically managed patients)? - Yes 6. Beta Blocker prescribed? - No - bradycardia 7. High Intensity Statin (Lipitor 40-80mg  or Crestor 20-40mg ) prescribed? - Yes 8. EF assessed during THIS hospitalization? - Yes 9. For EF  <40%, was ACEI/ARB prescribed? - Not Applicable (EF >/= 65%) 10. For EF <40%, Aldosterone Antagonist (Spironolactone or Eplerenone) prescribed? - Not Applicable (EF >/= 78%) 11. Cardiac Rehab Phase II ordered (including medically managed patients)? - Yes       _____________  Discharge Vitals Blood pressure 134/86, pulse 64, temperature 97.8 F (36.6 C), temperature source Oral, resp. rate 15, height 5\' 11"  (1.803 m), weight 75.6 kg, SpO2 98 %.  Filed Weights   02/19/21 2005 02/20/21 0519 02/21/21 0240  Weight: 76 kg 74.4 kg 75.6 kg    Labs & Radiologic Studies    CBC Recent Labs    02/20/21 0240 02/21/21 0105  WBC 5.7 6.8  HGB 13.8 13.3  HCT 39.4 39.2  MCV 91.2 91.6  PLT 139* 469   Basic Metabolic Panel Recent Labs    02/20/21 0240 02/21/21 0105  NA 138 135  K 3.9 4.0  CL 105 106  CO2 25 22  GLUCOSE 113* 140*  BUN 19 20  CREATININE 1.08 1.16  CALCIUM 9.0 8.8*   Liver Function Tests No results for input(s): AST, ALT, ALKPHOS, BILITOT, PROT, ALBUMIN in the last 72 hours. No results for input(s): LIPASE, AMYLASE in the last 72 hours. High Sensitivity Troponin:   Recent Labs  Lab 02/19/21 1434 02/19/21 1634 02/19/21 1933  TROPONINIHS 297* 370* 1,019*    BNP Invalid input(s): POCBNP D-Dimer No results for input(s): DDIMER in the last 72 hours. Hemoglobin A1C Recent Labs    02/20/21 0240  HGBA1C 5.9*   Fasting Lipid Panel Recent Labs    02/20/21 0240  CHOL 125  HDL 24*  LDLCALC 82  TRIG 94  CHOLHDL 5.2   Thyroid Function Tests No results for input(s): TSH, T4TOTAL, T3FREE, THYROIDAB in the last 72 hours.  Invalid input(s): FREET3 _____________  DG Chest 2 View  Result Date: 02/19/2021 CLINICAL DATA:  Chest pain EXAM: CHEST - 2 VIEW COMPARISON:  None. FINDINGS: The heart size and mediastinal contours are within normal limits. Both lungs are clear. The visualized skeletal structures are unremarkable. IMPRESSION: No active cardiopulmonary  disease. Electronically Signed   By: Inez Catalina M.D.   On: 02/19/2021 15:15   CARDIAC CATHETERIZATION  Result Date: 02/20/2021  The left ventricular systolic function is normal. The left ventricular ejection fraction is 55-65% by visual estimate.  LV end diastolic pressure is normal. There is no aortic valve stenosis.  1st Diag lesion is 35% stenosed -otherwise no significant disease noted in the LCA system.  ---------------------------------------------  Lesion #1: Ost RCA lesion is 75% stenosed -not previously stented  A drug-eluting stent was successfully placed overlapping the more proximal of the original Drug-Eluting Stents (Promus DES 3.5 mm 23 mm -> postdilated to 3.75 millimeters), using a STENT RESOLUTE ONYX 3.5X15. -> Postdilated to 4.1 mm at both the overlap and in the ostium.  Post intervention, there is a 0% residual stenosis of the lesion in the overlap.  ---------------------------------------------  Lesion #2: Prox RCA to Mid RCA is 99% stenosed -> at the initial overlap of the Drug-Eluting Stents within the original BMS..  Scoring balloon angioplasty was performed using a BALLOON WOLVERINE 3.25X10 followed by post dilation with a 3.75 mm [place] balloon  Post intervention, there is a 50% residual stenosis (in-stent restenosis).  ---------------------------------------------  Lesion #3: mid RCA-1 lesion is 70% stenosed -> in the more distal of the original DES stents (Promus 3.5 mm x 23 mm -originally postdilated to 3.75 mm)  Scoring balloon angioplasty was performed using a BALLOON WOLVERINE 3.25X10 followed by post dilation with a 3.75 mm [place] balloon  Post intervention, there is a 30% residual stenosis (in-stent restenosis).  ---------------------------------------------  Lesion #4: Mid RCA-2 lesion is 95% stenosed -> the distal edge of the second DES stent.  A drug-eluting stent was successfully placed (after extensive PTCA of this lesion starting with a 1.5 mm stepwise to 2.5  mm balloon) using a STENT RESOLUTE ONYX 3.0X15. Postdilated distally to 3.0 mm and proximally to 3.6 mm  Post intervention, there is a 0% residual stenosis of the lesion in the overlap  SUMMARY  Severe single-vessel CAD of the RCA: 75% ostial RCA followed by long stented segment-there is 99% ISR at the original stent overlap site followed by 70% stenosis within the stent, there is a 95% stenosis just distal to the stent. ? Successful DES PCI of the distal stented segment crossing the 95% stenosis with a Resolute Onyx  DES 3.0 mm 15 mm postdilated to 3.3 mm distally and  3.8 mm proximal at the overlap. ? Successful DES PCI of the ostial RCA overlapping the more proximal DES stent (95% with a Resolute Onyx DES 3.5 mm x 15 mm postdilated to 4.0 mm with 4.1 mm the ostium. ? Borderline successful PTCA of the significant in-stent restenosis (99%) in the overlapping stent segment.  Despite the use of a 3.25 mm  scoring balloon and high-pressure PTCA using a 3.75 mm post elation balloon, we are only able to reduce the in-stent restenosis to 50%. ? TIMI-3 flow pre and post.  Mild 30% proximal D1 stenosis with minimal disease in the LAD and LCx system otherwise.  Well-preserved LVEF, with normal LVEDP. RECOMMENDATIONS  Return if needed for ongoing care.  Monitor for return of symptoms.  Continue aggressive discharge medication Glenetta Hew, MD  ECHOCARDIOGRAM COMPLETE  Result Date: 02/20/2021    ECHOCARDIOGRAM REPORT   Patient Name:   [name] Date of Exam: 02/20/2021 Medical Rec #:  [id number]        Height:       71.0 in Accession #:    [id number]       Weight:       164.1 lb Date of Birth:  [birth year]        BSA:          1.939 m Patient Age:    80 years         BP:           103/77 mmHg Patient Gender: M                HR:           60 bpm. Exam Location:  Inpatient Procedure: 2D Echo, Cardiac Doppler and Color Doppler Indications:    NSTEMI  History:        Patient has prior history of Echocardiogram  examinations, most                 recent 02/07/2020. CAD and Previous Myocardial Infarction; Risk                 Factors:Dyslipidemia.  Sonographer:    Clayton Lefort RDCS (AE) Referring Phys: [id number] Darreld Mclean  Sonographer Comments: Image acquisition challenging due to respiratory motion. IMPRESSIONS  1. Left ventricular ejection fraction, by estimation, is 55 to 60%. The left ventricle has normal function. The left ventricle demonstrates regional wall motion abnormalities (see scoring diagram/findings for description). Left ventricular diastolic parameters are consistent with Grade I diastolic dysfunction (impaired relaxation). There is mild hypokinesis of the left ventricular, basal inferior wall and inferolateral wall.  2. Right ventricular systolic function is normal. The right ventricular size is normal. There is normal pulmonary artery systolic pressure. The estimated right ventricular systolic pressure is 43.1 mmHg.  3. The mitral valve is normal in structure. Trivial mitral valve regurgitation. No evidence of mitral stenosis.  4. The aortic valve is normal in structure. Aortic valve regurgitation is not visualized. No aortic stenosis is present.  5. The inferior vena cava is normal in size with greater than 50% respiratory variability, suggesting right atrial pressure of 3 mmHg. Comparison(s): Prior images reviewed side by side. The left ventricular function is unchanged. The left ventricular wall motion abnormality is worse. FINDINGS  Left Ventricle: Left ventricular ejection fraction, by estimation, is 55 to 60%. The left ventricle has normal function. The left ventricle demonstrates regional wall motion abnormalities. Mild hypokinesis of the left ventricular, basal inferior wall and inferolateral wall. The  left ventricular internal cavity size was normal in size. There is no left ventricular hypertrophy. Left ventricular diastolic parameters are consistent with Grade I diastolic dysfunction  (impaired relaxation). Normal left ventricular filling pressure. Right Ventricle: The right ventricular size is normal. No increase in right ventricular wall thickness. Right ventricular systolic function is normal. There is normal pulmonary artery systolic pressure. The tricuspid regurgitant velocity is 2.27 m/s, and  with an assumed right atrial pressure of 3 mmHg, the estimated right ventricular systolic pressure is 63.0 mmHg. Left Atrium: Left atrial size was normal in size. Right Atrium: Right atrial size was normal in size. Pericardium: There is no evidence of pericardial effusion. Mitral Valve: The mitral valve is normal in structure. Trivial mitral valve regurgitation. No evidence of mitral valve stenosis. Tricuspid Valve: The tricuspid valve is normal in structure. Tricuspid valve regurgitation is trivial. No evidence of tricuspid stenosis. Aortic Valve: The aortic valve is normal in structure. Aortic valve regurgitation is not visualized. No aortic stenosis is present. Aortic valve mean gradient measures 2.0 mmHg. Aortic valve peak gradient measures 3.6 mmHg. Aortic valve area, by VTI measures 2.39 cm. Pulmonic Valve: The pulmonic valve was normal in structure. Pulmonic valve regurgitation is not visualized. No evidence of pulmonic stenosis. Aorta: The aortic root is normal in size and structure. Venous: The inferior vena cava is normal in size with greater than 50% respiratory variability, suggesting right atrial pressure of 3 mmHg. IAS/Shunts: No atrial level shunt detected by color flow Doppler.  LEFT VENTRICLE PLAX 2D LVIDd:         4.70 cm  Diastology LVIDs:         3.20 cm  LV e' medial:    6.09 cm/s LV PW:         1.30 cm  LV E/e' medial:  11.4 LV IVS:        1.00 cm  LV e' lateral:   8.59 cm/s LVOT diam:     2.10 cm  LV E/e' lateral: 8.1 LV SV:         54 LV SV Index:   28 LVOT Area:     3.46 cm  RIGHT VENTRICLE             IVC RV Basal diam:  3.10 cm     IVC diam: 1.10 cm RV S prime:     15.00  cm/s TAPSE (M-mode): 1.7 cm LEFT ATRIUM           Index       RIGHT ATRIUM           Index LA diam:      3.00 cm 1.55 cm/m  RA Area:     14.50 cm LA Vol (A2C): 46.0 ml 23.73 ml/m RA Volume:   33.10 ml  17.07 ml/m LA Vol (A4C): 26.2 ml 13.52 ml/m  AORTIC VALVE AV Area (Vmax):    2.52 cm AV Area (Vmean):   2.24 cm AV Area (VTI):     2.39 cm AV Vmax:           95.30 cm/s AV Vmean:          71.100 cm/s AV VTI:            0.226 m AV Peak Grad:      3.6 mmHg AV Mean Grad:      2.0 mmHg LVOT Vmax:         69.20 cm/s LVOT Vmean:        46.000  cm/s LVOT VTI:          0.156 m LVOT/AV VTI ratio: 0.69  AORTA Ao Asc diam: 3.40 cm MITRAL VALVE               TRICUSPID VALVE MV Area (PHT): 2.39 cm    TR Peak grad:   20.6 mmHg MV Decel Time: 317 msec    TR Vmax:        227.00 cm/s MV E velocity: 69.40 cm/s MV A velocity: 90.40 cm/s  SHUNTS MV E/A ratio:  0.77        Systemic VTI:  0.16 m                            Systemic Diam: 2.10 cm Dani Gobble Croitoru MD Electronically signed by Sanda Klein MD Signature Date/Time: 02/20/2021/12:38:07 PM    Final    Disposition   Pt is being discharged home today in good condition.  Follow-up Plans & Appointments     Follow-up Information    Burnell Blanks, MD Follow up.   Specialty: Cardiology Why: Office will call you with a follow up appt in the next 2-3 business days. If you have not received at call by that time, please call to schedule.  Contact information: [place] [place] [place] [address] [phone]        Dsa, Joylin G, MD Follow up.   Specialty: Family Medicine Why: Follow up regarding recent hospital stay  Contact information: [address] [place] [address] [phone]              Discharge Instructions    Diet - low sodium heart healthy   Complete by: As directed    Discharge instructions   Complete by: As directed    Radial Site Care Refer to this sheet in the next few weeks. These instructions  provide you with information on caring for yourself after your procedure. Your caregiver may also give you more specific instructions. Your treatment has been planned according to current medical practices, but problems sometimes occur. Call your caregiver if you have any problems or questions after your procedure. HOME CARE INSTRUCTIONS You may shower the day after the procedure.Remove the bandage (dressing) and gently wash the site with plain soap and water.Gently pat the site dry.  Do not apply powder or lotion to the site.  Do not submerge the affected site in water for 3 to 5 days.  Inspect the site at least twice daily.  Do not flex or bend the affected arm for 24 hours.  No lifting over 5 pounds (2.3 kg) for 5 days after your procedure.  Do not drive home if you are discharged the same day of the procedure. Have someone else drive you.  You may drive 24 hours after the procedure unless otherwise instructed by your caregiver.  What to expect: Any bruising will usually fade within 1 to 2 weeks.  Blood that collects in the tissue (hematoma) may be painful to the touch. It should usually decrease in size and tenderness within 1 to 2 weeks.  SEEK IMMEDIATE MEDICAL CARE IF: You have unusual pain at the radial site.  You have redness, warmth, swelling, or pain at the radial site.  You have drainage (other than a small amount of blood on the dressing).  You have chills.  You have a fever or persistent symptoms for more than 72 hours.  You have a  fever and your symptoms suddenly get worse.  Your arm becomes pale, cool, tingly, or numb.  You have heavy bleeding from the site. Hold pressure on the site.   PLEASE DO NOT MISS ANY DOSES OF YOUR BRILINTA!!!!! Also keep a log of you blood pressures and bring back to your follow up appt. Please call the office with any questions.   Patients taking blood thinners should generally stay away from medicines like ibuprofen, Advil, Motrin, naproxen, and  Aleve due to risk of stomach bleeding. You may take Tylenol as directed or talk to your primary doctor about alternatives.   PLEASE ENSURE THAT YOU DO NOT RUN OUT OF YOUR BRILINTA. This medication is very important to remain on for at least one year. IF you have issues obtaining this medication due to cost please CALL the office 3-5 business days prior to running out in order to prevent missing doses of this medication.   Increase activity slowly   Complete by: As directed       Discharge Medications   Allergies as of 02/21/2021      Reactions   Gadolinium Derivatives Hives   Multihance [gadobenate] Hives   [name]   Iodine Rash      Medication List    TAKE these medications   aspirin 81 MG tablet Take 81 mg by mouth daily.   atorvastatin 80 MG tablet Commonly known as: Lipitor Take 1 tablet (80 mg total) by mouth daily. What changed:   medication strength  how much to take   dorzolamide-timolol 22.3-6.8 MG/ML ophthalmic solution Commonly known as: COSOPT Place 1 drop into both eyes daily.   lisinopril 5 MG tablet Commonly known as: ZESTRIL Take 1 tablet (5 mg total) by mouth daily.   LORazepam 0.5 MG tablet Commonly known as: ATIVAN Take 1 tablet by mouth as needed for sleep.   Lumigan 0.01 % Soln Generic drug: bimatoprost Place 1 drop into both eyes daily.   nitroGLYCERIN 0.4 MG SL tablet Commonly known as: NITROSTAT Place 1 tablet (0.4 mg total) under the tongue every 5 (five) minutes x 3 doses as needed for chest pain.   pantoprazole 40 MG tablet Commonly known as: PROTONIX Take 1 tablet (40 mg total) by mouth daily.   ticagrelor 90 MG Tabs tablet Commonly known as: BRILINTA Take 1 tablet (90 mg total) by mouth 2 (two) times daily.          Outstanding Labs/Studies   CBC at follow up LFT/FLP in 3 months  Duration of Discharge Encounter   Greater than 30 minutes including physician time.  Signed, Reino Bellis, NP 02/21/2021, 8:27 AM

## 2021-02-21 NOTE — Plan of Care (Signed)
  Problem: Education: Goal: Knowledge of General Education information will improve Description: Including pain rating scale, medication(s)/side effects and non-pharmacologic comfort measures Outcome: Completed/Met   Problem: Health Behavior/Discharge Planning: Goal: Ability to manage health-related needs will improve Outcome: Completed/Met   Problem: Clinical Measurements: Goal: Ability to maintain clinical measurements within normal limits will improve Outcome: Completed/Met Goal: Will remain free from infection Outcome: Completed/Met Goal: Diagnostic test results will improve Outcome: Completed/Met Goal: Respiratory complications will improve Outcome: Completed/Met Goal: Cardiovascular complication will be avoided Outcome: Completed/Met   Problem: Activity: Goal: Risk for activity intolerance will decrease Outcome: Completed/Met   Problem: Nutrition: Goal: Adequate nutrition will be maintained Outcome: Completed/Met   Problem: Coping: Goal: Level of anxiety will decrease Outcome: Completed/Met   Problem: Elimination: Goal: Will not experience complications related to bowel motility Outcome: Completed/Met Goal: Will not experience complications related to urinary retention Outcome: Completed/Met   Problem: Pain Managment: Goal: General experience of comfort will improve Outcome: Completed/Met   Problem: Safety: Goal: Ability to remain free from injury will improve Outcome: Completed/Met   Problem: Skin Integrity: Goal: Risk for impaired skin integrity will decrease Outcome: Completed/Met   Problem: Education: Goal: Understanding of CV disease, CV risk reduction, and recovery process will improve Outcome: Completed/Met Goal: Individualized Educational Video(s) Outcome: Completed/Met   Problem: Activity: Goal: Ability to return to baseline activity level will improve Outcome: Completed/Met   Problem: Cardiovascular: Goal: Ability to achieve and maintain  adequate cardiovascular perfusion will improve Outcome: Completed/Met Goal: Vascular access site(s) Level 0-1 will be maintained Outcome: Completed/Met   Problem: Health Behavior/Discharge Planning: Goal: Ability to safely manage health-related needs after discharge will improve Outcome: Completed/Met   Discharge instructions reviewed with patient.  These included, but were not limited to, the following:  medications, prescriptions (including those mailed to [facility] as per patient's request), activity level, cardiac re-hab goals, dietary recommendations, symptoms r/t cardiac disease, when to call the MD, when to call EMS, follow-up appointments, Brilinta side-effects and rational for use, etc.  Comprehension of instructions ascertained via "teach-back" communication technique.

## 2021-02-21 NOTE — Progress Notes (Signed)
CARDIAC REHAB PHASE I   PRE:  Rate/Rhythm: 60 SR   BP:  Supine: 133/75  Sitting:   Standing:    SaO2: 99 RA  MODE:  Ambulation: 470 ft   POST:  Rate/Rhythm: 85 SR  BP:  Supine: 154/85  Sitting:   Standing:    SaO2: 99RA  Pt tolerated exercise well with standby assist and no assistive device. Pt abl 470 ft with no c/o CP SOB or pain, pt was mildly unsteady but states this is baseline for him because of his sight issues. Education given on MI booklet, radial restrictions, heart healthy diet and adherence to aspirin, NTG and Brilinta. Pt was given home exercise instructions and will refer to [facility] phase 2 cardiac rehab. Pt verbalized understand and all questions were answered. Pt left in bed with call bell in reach.   0800-0900 [name] ACSM-EP 02/21/2021 8:51 AM

## 2021-02-21 NOTE — Progress Notes (Signed)
Patient discharged to home with wife accompanied by daughter.  Escorted to exit via wheelchair by nurse tech.

## 2021-02-23 ENCOUNTER — Encounter (HOSPITAL_COMMUNITY): Payer: Self-pay | Admitting: Cardiology

## 2021-02-24 ENCOUNTER — Telehealth (HOSPITAL_COMMUNITY): Payer: Self-pay

## 2021-02-24 NOTE — Telephone Encounter (Signed)
Pt insurance is active and benefits verified through [facility]. Co-pay $10.00, DED $0.00/$0.00 met, out of pocket $3,900.00/$20.00 met, co-insurance 0%. No pre-authorization required. Passport, 02/24/21 @ 9:04AM, ORJ#[id number]  Will contact patient to see if he is interested in the Cardiac Rehab Program. If interested, patient will need to complete follow up appt. Once completed, patient will be contacted for scheduling upon review by the RN Navigator.

## 2021-02-24 NOTE — Telephone Encounter (Signed)
Attempted to call patient in regards to Cardiac Rehab - LM on VM 

## 2021-03-04 ENCOUNTER — Ambulatory Visit: Payer: Medicare HMO | Admitting: Cardiovascular Disease

## 2021-03-04 ENCOUNTER — Other Ambulatory Visit: Payer: Self-pay

## 2021-03-04 ENCOUNTER — Encounter: Payer: Self-pay | Admitting: Cardiovascular Disease

## 2021-03-04 VITALS — BP 104/66 | HR 75 | Ht 71.0 in | Wt 166.2 lb

## 2021-03-04 DIAGNOSIS — E7800 Pure hypercholesterolemia, unspecified: Secondary | ICD-10-CM

## 2021-03-04 DIAGNOSIS — I2510 Atherosclerotic heart disease of native coronary artery without angina pectoris: Secondary | ICD-10-CM | POA: Diagnosis not present

## 2021-03-04 DIAGNOSIS — I340 Nonrheumatic mitral (valve) insufficiency: Secondary | ICD-10-CM

## 2021-03-04 MED ORDER — LISINOPRIL 2.5 MG PO TABS: 2.5000 mg | ORAL_TABLET | Freq: Every day | ORAL | 3 refills | Status: DC

## 2021-03-04 NOTE — Progress Notes (Signed)
Chief Complaint  Patient presents with  . Follow-up    CAD   History of Present Illness: 80 yo male with history of CAD and HLD here today for cardiac follow up. In 1999 he had a stent placed in the RCA. 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 Echo 08/25/12 with normal LVEF, mild MR, grade 2 diastolic dysfunction. He was admitted to [facility] 02/19/21 with chest pain and ruled in for a NSTEMI. Cardiac cath 02/20/21 per Dr. Ellyn Hack with severe RCA disease. Two drug eluting stents were placed in the RCA and a segment of stent restenosis was treated with cutting balloon angioplasty. Echo 02/20/21 with LVEF=55-60%. No significant valve disease. He was discharged on ASA and Brilinta.   He is here today for follow up. The patient denies any chest pain, dyspnea, palpitations, lower extremity edema, orthopnea, PND, dizziness, near syncope or syncope. He has some fatigue but his chest pain has resolved. He has been taking 120 mg of Lipitor daily as he was confused.    Primary Care Physician: Geraldo Pitter, MD Azell Der, MD   Past Medical History:  Diagnosis Date  . 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 Hyperlipidemia    with low high-density lipoprotein  . Kidney stone     Past Surgical History:  Procedure Laterality Date  . BACK SURGERY    . CHOLECYSTECTOMY    . CORONARY STENT INTERVENTION N/A 02/20/2021   Procedure: CORONARY STENT INTERVENTION;  Surgeon: Leonie Man, MD;  Location: [facility];  Service: Cardiovascular;  Laterality: N/A;  . CORONARY STENT PLACEMENT     2 overlapping Promus DES (covering existing BMS) in RCA: Promus DES 3.5x23 (x 2) -> post-dilated to 3.75  mm  . LEFT HEART CATH AND CORONARY ANGIOGRAPHY N/A  02/20/2021   Procedure: LEFT HEART CATH AND CORONARY ANGIOGRAPHY;  Surgeon: Leonie Man, MD;  Location: [facility];  Service: Cardiovascular;  Laterality: N/A;    Current Outpatient Medications  Medication Sig Dispense Refill  . aspirin 81 MG tablet Take 81 mg by mouth daily.    Marland Kitchen atorvastatin (LIPITOR) 80 MG tablet Take 1 tablet (80 mg total) by mouth daily. 90 tablet 2  . dorzolamide-timolol (COSOPT) 22.3-6.8 MG/ML ophthalmic solution Place 1 drop into both eyes daily.    . finasteride (PROSCAR) 5 MG tablet Take 5 mg by mouth daily.    Marland Kitchen lisinopril (ZESTRIL) 2.5 MG tablet Take 1 tablet (2.5 mg total) by mouth daily. 90 tablet 3  . LORazepam (ATIVAN) 0.5 MG tablet Take 1 tablet by mouth as needed for sleep.    Marland Kitchen LUMIGAN 0.01 % SOLN Place 1 drop into both eyes daily.    . nitroGLYCERIN (NITROSTAT) 0.4 MG SL tablet Place 1 tablet (0.4 mg total) under the tongue every 5 (five) minutes x 3 doses as needed for chest pain. 25 tablet 2  . pantoprazole (PROTONIX) 40 MG tablet Take 1 tablet (40 mg total) by mouth daily. 30 tablet 0  . tamsulosin (FLOMAX) 0.4 MG CAPS capsule Take 0.4 mg by mouth.    . ticagrelor (BRILINTA) 90 MG TABS tablet Take 1 tablet (90 mg total) by mouth 2 (two) times daily. [place]  tablet 2   No current facility-administered medications for this visit.    Allergies  Allergen Reactions  . Gadolinium Derivatives Hives  . Multihance [Gadobenate] Hives    [name]  . Iodine Rash    Social History   Socioeconomic History  . Marital status: Married    Spouse name: Not on file  . Number of children: Not on file  . Years of education: Not on file  . Highest education level: Not on file  Occupational History  . Not on file  Tobacco Use  . Smoking status: Never Smoker  . Smokeless tobacco: Never Used  Vaping Use  . Vaping Use: Never used  Substance and Sexual Activity  . Alcohol use: Yes  . Drug use: No  . Sexual activity: Not on  file  Other Topics Concern  . Not on file  Social History Narrative  . Not on file   Social Determinants of Health   Financial Resource Strain: Not on file  Food Insecurity: Not on file  Transportation Needs: Not on file  Physical Activity: Not on file  Stress: Not on file  Social Connections: Not on file  Intimate Partner Violence: Not on file    History reviewed. No pertinent family history.  Review of Systems:  As stated in the HPI and otherwise negative.   BP 104/66   Pulse 75   Ht 5\' 11"  (1.803 m)   Wt 166 lb 3.2 oz (75.4 kg)   SpO2 99%   BMI 23.18 kg/m   Physical Examination:  General: Well developed, well nourished, NAD  HEENT: OP clear, mucus membranes moist  SKIN: warm, dry. No rashes. Neuro: No focal deficits  Musculoskeletal: Muscle strength 5/5 all ext  Psychiatric: Mood and affect normal  Neck: No JVD, no carotid bruits, no thyromegaly, no lymphadenopathy.  Lungs:Clear bilaterally, no wheezes, rhonci, crackles Cardiovascular: Regular rate and rhythm. No murmurs, gallops or rubs. Abdomen:Soft. Bowel sounds present. Non-tender.  Extremities: No lower extremity edema. Pulses are 2 + in the bilateral DP/PT.  Echo March 2022: 1. Left ventricular ejection fraction, by estimation, is 55 to 60%. The  left ventricle has normal function. The left ventricle demonstrates  regional wall motion abnormalities (see scoring diagram/findings for  description). Left ventricular diastolic  parameters are consistent with Grade I diastolic dysfunction (impaired  relaxation). There is mild hypokinesis of the left ventricular, basal  inferior wall and inferolateral wall.  2. Right ventricular systolic function is normal. The right ventricular  size is normal. There is normal pulmonary artery systolic pressure. The  estimated right ventricular systolic pressure is 70.1 mmHg.  3. The mitral valve is normal in structure. Trivial mitral valve  regurgitation. No evidence of  mitral stenosis.  4. The aortic valve is normal in structure. Aortic valve regurgitation is  not visualized. No aortic stenosis is present.  5. The inferior vena cava is normal in size with greater than 50%  respiratory variability, suggesting right atrial pressure of 3 mmHg.   EKG:  EKG is not ordered today. The ekg ordered today demonstrates   Recent Labs: 02/21/2021: BUN 20; Creatinine, Ser 1.16; Hemoglobin 13.3; Platelets 155; Potassium 4.0; Sodium 135   Lipid Panel    Component Value Date/Time   CHOL 125 02/20/2021 0240   TRIG 94 02/20/2021 0240   HDL 24 (L) 02/20/2021 0240   CHOLHDL 5.2 02/20/2021 0240   VLDL 19 02/20/2021 0240   LDLCALC 82 02/20/2021 0240  Wt Readings from Last 3 Encounters:  03/04/21 166 lb 3.2 oz (75.4 kg)  02/21/21 166 lb 11.2 oz (75.6 kg)  05/24/20 175 lb (79.4 kg)     Other studies Reviewed: Additional studies/ records that were reviewed today include:  Review of the above records demonstrates:   Assessment and Plan:   1. CAD without angina: No chest pain following his recent PCI/stenting of the RCA. Will continue ASA and Brilinta for one year post PCI. Continue statin. No beta blocker due to bradycardia. Some fatigue. Will lower Lisinopril to 2.5 mg daily.   2. HYPERLIPIDEMIA: Lipids followed in primary care. LDL near goal in March 2022. Lipitor dose increased to 80 mg daily prior to discharge two weeks ago. Will continue statin. He should be taking 80 mg per day. Will check lipids and LFTs 3 months after dose increase.     3. Mitral regurgitation: Trivial MR by echo in March 2022  Current medicines are reviewed at length with the patient today.  The patient does not have concerns regarding medicines.  The following changes have been made:  no change  Labs/ tests ordered today include:   Orders Placed This Encounter  Procedures  . Lipid panel  . Hepatic function panel   Disposition:   F/U with me in 12  months   Signed, Lauree Chandler, MD 03/04/2021 3:13 PM    [facility] [address], [place], [place]  [address] Phone: [phone]; Fax: [phone]

## 2021-03-04 NOTE — Patient Instructions (Signed)
Medication Instructions:  Your physician has recommended you make the following change in your medication:  1.) decrease lisinopril to 2.5 mg once a day  *If you need a refill on your cardiac medications before your next appointment, please call your pharmacy*   Lab Work: In 3 months - please return for fasting lipids/liver function  If you have labs (blood work) drawn today and your tests are completely normal, you will receive your results only by: Marland Kitchen MyChart Message (if you have MyChart) OR . A paper copy in the mail If you have any lab test that is abnormal or we need to change your treatment, we will call you to review the results.   Testing/Procedures: none   Follow-Up: At [facility], you and your health needs are our priority.  As part of our continuing mission to provide you with exceptional heart care, we have created designated Provider Care Teams.  These Care Teams include your primary Cardiologist (physician) and Advanced Practice Providers (APPs -  Physician Assistants and Nurse Practitioners) who all work together to provide you with the care you need, when you need it.  We recommend signing up for the patient portal called "MyChart".  Sign up information is provided on this After Visit Summary.  MyChart is used to connect with patients for Virtual Visits (Telemedicine).  Patients are able to view lab/test results, encounter notes, upcoming appointments, etc.  Non-urgent messages can be sent to your provider as well.   To learn more about what you can do with MyChart, go to [email or web address].    Your next appointment:   6 month(s)  The format for your next appointment:   In Person  Provider:   You may see Lauree Chandler, MD or one of the following Advanced Practice Providers on your designated Care Team:    Melina Copa, PA-C  Ermalinda Barrios, PA-C   Other Instructions

## 2021-03-13 ENCOUNTER — Other Ambulatory Visit: Payer: Self-pay | Admitting: Cardiology

## 2021-04-08 ENCOUNTER — Encounter (HOSPITAL_COMMUNITY): Payer: Self-pay

## 2021-04-08 ENCOUNTER — Telehealth (HOSPITAL_COMMUNITY): Payer: Self-pay

## 2021-04-08 NOTE — Telephone Encounter (Signed)
Attempted to call patient in regards to Cardiac Rehab - LM on VM Mailed letter 

## 2021-05-07 ENCOUNTER — Telehealth: Payer: Self-pay | Admitting: Cardiovascular Disease

## 2021-05-07 ENCOUNTER — Telehealth (HOSPITAL_COMMUNITY): Payer: Self-pay

## 2021-05-07 NOTE — Telephone Encounter (Signed)
[name] from cardiac rehab calling to report this patient has daily episodes of diarrhea. He states he has had diarrhea daily since being discharged from the hospital in March. [name] is not sure if this is drug related. Of note, pt does have a history of hematochezia during his hospital stay. It does not appear he has followed up with GI. [name] contacted pts PCP and was not able to find him an appointment. He wanted to see if Dr. Angelena [name] had any recommendations that could help this patient.  Pt does not have issues with hypotension, dizziness, or falls.   I will forward to Dr. Angelena [name] and his RN for review and follow up with the patient.

## 2021-05-07 NOTE — Telephone Encounter (Signed)
Called patient to confirm appt for 05/12/21 @ 9:30. In the process of completing the health history pt voices he has had diarrhea since leaving the hospital in March but did not mention it to his physician. I called the PCP, Joylin Dsa MD office and report the pt's symptoms and asked them to call the patient to get him an appt ASAP. The pt states he has an appt with the PCP in July. They stated there we no openings until August. Also placed call to Dr. Camillia Herter office and spoke with Ander Purpura. Informed her of the pt's symptoms. She voices she will send a message to Dr. Angelena Form about this issue. Pt called to inform that I had called his PCP and cardiologist and that he should follow-up about this issue. Instructed pt to seek treatment at ER/Urgent care if needed.  Lesly Rubenstein MS, ACSM-CEP, CCRP

## 2021-05-07 NOTE — Telephone Encounter (Signed)
New Message:    Shanon Brow from [place] wanted to talk to Dr Camillia Herter nurse  about this pt. [employer]. Please call him before he leaves at 4:00 .Marland Kitchen

## 2021-05-07 NOTE — Telephone Encounter (Signed)
LVM for return call. 

## 2021-05-08 NOTE — Telephone Encounter (Signed)
Called and spoke w patient.  Adv of recommendation to talk w PCP office for work up of diarrhea.  Adv to call and report symptoms to them even though they may not be able to get him in for a visit in the near future, they will still want to know if he is having problems and can assist.

## 2021-05-12 ENCOUNTER — Ambulatory Visit (HOSPITAL_COMMUNITY): Payer: Medicare HMO

## 2021-05-18 ENCOUNTER — Ambulatory Visit (HOSPITAL_COMMUNITY): Payer: Medicare HMO

## 2021-05-20 ENCOUNTER — Ambulatory Visit (HOSPITAL_COMMUNITY): Payer: Medicare HMO

## 2021-05-22 ENCOUNTER — Ambulatory Visit (HOSPITAL_COMMUNITY): Payer: Medicare HMO

## 2021-05-25 ENCOUNTER — Ambulatory Visit (HOSPITAL_COMMUNITY): Payer: Medicare HMO

## 2021-05-26 ENCOUNTER — Ambulatory Visit (HOSPITAL_COMMUNITY)
Admission: EM | Admit: 2021-05-26 | Discharge: 2021-05-26 | Disposition: A | Discharge status: Home / Self Care | Payer: Medicare HMO | Attending: Physician Assistant | Admitting: Physician Assistant

## 2021-05-26 ENCOUNTER — Encounter (HOSPITAL_COMMUNITY): Payer: Self-pay | Admitting: Emergency Medicine

## 2021-05-26 ENCOUNTER — Other Ambulatory Visit: Payer: Self-pay

## 2021-05-26 DIAGNOSIS — S61412A Laceration without foreign body of left hand, initial encounter: Secondary | ICD-10-CM

## 2021-05-26 DIAGNOSIS — S61402A Unspecified open wound of left hand, initial encounter: Secondary | ICD-10-CM

## 2021-05-26 DIAGNOSIS — Z23 Encounter for immunization: Secondary | ICD-10-CM

## 2021-05-26 MED ORDER — TETANUS-DIPHTH-ACELL PERTUSSIS 5-2.5-18.5 LF-MCG/0.5 IM SUSY
0.5000 mL | PREFILLED_SYRINGE | Freq: Once | INTRAMUSCULAR | Status: AC
  Administered 2021-05-26: 0.5 mL via INTRAMUSCULAR

## 2021-05-26 MED ORDER — TETANUS-DIPHTHERIA TOXOIDS TD 5-2 LFU IM INJ: 0.5000 mL | INJECTION | Freq: Once | INTRAMUSCULAR | Status: DC

## 2021-05-26 MED ORDER — MUPIROCIN 2 % EX OINT: 1.0000 "application " | TOPICAL_OINTMENT | Freq: Every day | CUTANEOUS | 0 refills | Status: DC

## 2021-05-26 MED ORDER — TETANUS-DIPHTH-ACELL PERTUSSIS 5-2.5-18.5 LF-MCG/0.5 IM SUSY
PREFILLED_SYRINGE | INTRAMUSCULAR | Status: AC
  Filled 2021-05-26: qty 0.5

## 2021-05-26 NOTE — ED Provider Notes (Addendum)
[place]    CSN: [id number] Arrival date & time: 05/26/21  1125      History   Chief Complaint Chief Complaint  Patient presents with   Laceration    HPI CYLE KENYON is a 80 y.o. male.   Patient presents today with a 5-day history of wound to his left hand.  Reports that he was at [facility] when a shopping cart bumped into his hand and he immediately began bleeding.  He has applied over-the-counter violet antibiotic medication without improvement of symptoms.  He denies any weakness, numbness, paresthesias.  He has had recurrent bleeding with dressing changes which she attributes to blood thinning medication; on aspirin and Brilinta due to CAD.  He denies any active bleeding.  Reports mild pain which is rated 4 on a 0-10 pain scale, localized to affected area, described as aching, no aggravating relieving factors identified.  He is right-handed.  He is unsure when his last tetanus was.   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, 07/25/2008 treated with 2 overlapping DES   Hyperlipidemia    with low high-density lipoprotein   Kidney stone     Patient Active Problem List   Diagnosis Date Noted   Hypertension 02/21/2021   Presence of drug coated stent in right coronary artery    NSTEMI (non-ST elevated myocardial infarction) ([place]) 02/19/2021   Hyperlipidemia 07/22/2009   Coronary artery disease involving native coronary artery of native heart with unstable angina pectoris ([place]) 07/22/2009    Past Surgical History:  Procedure Laterality Date   BACK SURGERY     CHOLECYSTECTOMY     CORONARY STENT INTERVENTION N/A 02/20/2021   Procedure: CORONARY STENT INTERVENTION;  Surgeon: Leonie Man, MD;  Location: [place] [facility];  Service: Cardiovascular;  Laterality: N/A;   CORONARY  STENT PLACEMENT     2 overlapping Promus DES (covering existing BMS) in RCA: Promus DES 3.5x23 (x 2) -> post-dilated to 3.75  mm   LEFT HEART CATH AND CORONARY ANGIOGRAPHY N/A 02/20/2021   Procedure: LEFT HEART CATH AND CORONARY ANGIOGRAPHY;  Surgeon: Leonie Man, MD;  Location: [place] [facility];  Service: Cardiovascular;  Laterality: N/A;       Home Medications    Prior to Admission medications   Medication Sig Start Date End Date Taking? Authorizing Provider  mupirocin ointment (BACTROBAN) 2 % Apply 1 application topically daily. 05/26/21  Yes Phineas Mcenroe, Derry Skill, PA-C  aspirin 81 MG tablet Take 81 mg by mouth daily.    [provider]  atorvastatin (LIPITOR) 80 MG tablet TAKE 1 TABLET BY MOUTH EVERY DAY 03/13/21   Burnell Blanks, MD  dorzolamide-timolol (COSOPT) 22.3-6.8 MG/ML ophthalmic solution Place 1 drop into both eyes daily. 08/26/17   [provider]  finasteride (PROSCAR) 5 MG tablet Take 5 mg by mouth daily.    [provider]  lisinopril (ZESTRIL) 2.5 MG tablet Take 1 tablet (2.5 mg total) by mouth daily. 03/04/21   Burnell Blanks, MD  LORazepam (ATIVAN) 0.5 MG tablet Take 1 tablet by mouth as needed for sleep. 10/18/18   [provider]  LUMIGAN 0.01 % SOLN Place 1 drop into both eyes daily. 10/17/17   [provider]  nitroGLYCERIN (NITROSTAT) 0.4 MG SL tablet Place 1 tablet (0.4 mg  total) under the tongue every 5 (five) minutes x 3 doses as needed for chest pain. 02/21/21   Cheryln Manly, NP  pantoprazole (PROTONIX) 40 MG tablet Take 1 tablet (40 mg total) by mouth daily. 02/21/21   Cheryln Manly, NP  tamsulosin (FLOMAX) 0.4 MG CAPS capsule Take 0.4 mg by mouth.    [provider]  ticagrelor (BRILINTA) 90 MG TABS tablet Take 1 tablet (90 mg total) by mouth 2 (two) times daily. 02/21/21   Cheryln Manly, NP    Family History History reviewed. No pertinent family history.  Social  History Social History   Tobacco Use   Smoking status: Never   Smokeless tobacco: Never  Vaping Use   Vaping Use: Never used  Substance Use Topics   Alcohol use: Not Currently   Drug use: No     Allergies   Gadolinium derivatives, Multihance [gadobenate], and Iodine   Review of Systems Review of Systems  Constitutional:  Negative for activity change, appetite change, fatigue and fever.  Respiratory:  Negative for cough and shortness of breath.   Cardiovascular:  Negative for chest pain.  Gastrointestinal:  Negative for abdominal pain, diarrhea, nausea and vomiting.  Skin:  Positive for wound. Negative for color change.  Neurological:  Negative for dizziness, light-headedness, numbness and headaches.    Physical Exam Triage Vital Signs ED Triage Vitals  Enc Vitals Group     BP 05/26/21 1208 129/77     Pulse Rate 05/26/21 1208 (!) 57     Resp 05/26/21 1208 18     Temp 05/26/21 1208 98.3 F (36.8 C)     Temp Source 05/26/21 1208 Oral     SpO2 05/26/21 1208 100 %     Weight --      Height --      Head Circumference --      Peak Flow --      Pain Score 05/26/21 1204 3     Pain Loc --      Pain Edu? --      Excl. in [place]? --    No data found.  Updated Vital Signs BP 129/77 (BP Location: Right Arm)   Pulse (!) 57   Temp 98.3 F (36.8 C) (Oral)   Resp 18   SpO2 100%   Visual Acuity Right Eye Distance:   Left Eye Distance:   Bilateral Distance:    Right Eye Near:   Left Eye Near:    Bilateral Near:     Physical Exam Vitals reviewed.  Constitutional:      General: He is awake.     Appearance: Normal appearance. He is normal weight. He is not ill-appearing.     Comments: Very pleasant male appears stated age no acute distress  HENT:     Head: Normocephalic and atraumatic.  Cardiovascular:     Rate and Rhythm: Normal rate and regular rhythm.     Pulses:          Radial pulses are 2+ on the right side and 2+ on the left side.     Heart sounds: Normal  heart sounds, S1 normal and S2 normal. No murmur heard.    Comments: Capillary refill within 2 seconds Pulmonary:     Effort: Pulmonary effort is normal.     Breath sounds: Normal breath sounds. No stridor. No wheezing, rhonchi or rales.     Comments: Clear to auscultation bilaterally Abdominal:     General: Bowel sounds are normal.  Palpations: Abdomen is soft.     Tenderness: There is no abdominal tenderness.  Musculoskeletal:     Comments: Hands neurovascularly intact  Skin:    Comments: Skin avulsion noted left dorsal hand with coagulated blood but no active bleeding.  No surrounding erythema, drainage, streaking.  Neurological:     Mental Status: He is alert.  Psychiatric:        Behavior: Behavior is cooperative.      UC Treatments / Results  Labs (all labs ordered are listed, but only abnormal results are displayed) Labs Reviewed - No data to display  EKG   Radiology No results found.  Procedures Procedures (including critical care time)  Medications Ordered in UC Medications  Tdap (BOOSTRIX) injection 0.5 mL (0.5 mLs Intramuscular Given 05/26/21 1300)    Initial Impression / Assessment and Plan / UC Course  I have reviewed the triage vital signs and the nursing notes.  Pertinent labs & imaging results that were available during my care of the patient were reviewed by me and considered in my medical decision making (see chart for details).      Area was cleaned in clinic with Hibiclens and irrigated with sterile saline.  No active bleeding during visit.  Will have patient apply Bactroban during dressing changes.  He was encouraged to use nonstick dressings and [place] to decrease changes of disturbing eschar causing rebleeding.  Tetanus was updated today.  No evidence of infection that warrant initiation of antibiotics.  Discussed signs or symptoms of infection that warrant reevaluation.  Strict return precautions given to which patient expressed  understanding.  He is to follow-up with PCP within 1 week to ensure adequate healing.  Final Clinical Impressions(s) / UC Diagnoses   Final diagnoses:  Skin tear of left hand without complication, initial encounter  Open wound of left hand with complication, initial encounter  Need for prophylactic vaccination with combined diphtheria-tetanus-pertussis (DTP) vaccine     Discharge Instructions      We updated your tetanus today.  This is probably going to take a while to heal.  Please apply Bactroban daily with dressing changes.  If you have any signs of infection (redness, drainage, pain, nausea, vomiting, fever) you need to be reevaluated.  If anything worsens please return for reevaluation.  Follow-up with PCP within 1 week to ensure appropriate healing.     ED Prescriptions     Medication Sig Dispense Auth. Provider   mupirocin ointment (BACTROBAN) 2 % Apply 1 application topically daily. 22 g Edelin Fryer K, PA-C      PDMP not reviewed this encounter.   Terrilee Croak, PA-C 05/26/21 1304    Aeliana Spates, Derry Skill, PA-C 05/26/21 1332

## 2021-05-26 NOTE — ED Triage Notes (Signed)
A grocery cart in a parking lot injured hand.  Injury to posterior left hand.  Patient can move fingers.  Hand and fingers are swollen.  Patient has brisk cap refill.

## 2021-05-26 NOTE — Discharge Instructions (Addendum)
We updated your tetanus today.  This is probably going to take a while to heal.  Please apply Bactroban daily with dressing changes.  If you have any signs of infection (redness, drainage, pain, nausea, vomiting, fever) you need to be reevaluated.  If anything worsens please return for reevaluation.  Follow-up with PCP within 1 week to ensure appropriate healing.

## 2021-05-27 ENCOUNTER — Ambulatory Visit (HOSPITAL_COMMUNITY): Payer: Medicare HMO

## 2021-05-29 ENCOUNTER — Ambulatory Visit (HOSPITAL_COMMUNITY): Payer: Medicare HMO

## 2021-06-03 ENCOUNTER — Other Ambulatory Visit: Payer: Medicare HMO

## 2021-06-03 ENCOUNTER — Ambulatory Visit (HOSPITAL_COMMUNITY): Payer: Medicare HMO

## 2021-06-03 ENCOUNTER — Other Ambulatory Visit: Payer: Self-pay

## 2021-06-03 DIAGNOSIS — E7800 Pure hypercholesterolemia, unspecified: Secondary | ICD-10-CM

## 2021-06-03 LAB — LIPID PANEL
Chol/HDL Ratio: 5.6 ratio — ABNORMAL HIGH (ref 0.0–5.0)
Cholesterol, Total: 146 mg/dL (ref 100–199)
HDL: 26 mg/dL — ABNORMAL LOW (ref >39)
LDL Chol Calc (NIH): 98 mg/dL (ref 0–99)
Triglycerides: 121 mg/dL (ref 0–149)
VLDL Cholesterol Cal: 22 mg/dL (ref 5–40)

## 2021-06-03 LAB — HEPATIC FUNCTION PANEL
ALT: 78 IU/L — ABNORMAL HIGH (ref 0–44)
AST: 58 IU/L — ABNORMAL HIGH (ref 0–40)
Albumin: 4 g/dL (ref 3.6–4.6)
Alkaline Phosphatase: 159 IU/L — ABNORMAL HIGH (ref 44–121)
Bilirubin Total: 0.6 mg/dL (ref 0.0–1.2)
Bilirubin, Direct: 0.17 mg/dL (ref 0.00–0.40)
Total Protein: 6.7 g/dL (ref 6.0–8.5)

## 2021-06-05 ENCOUNTER — Ambulatory Visit (HOSPITAL_COMMUNITY): Payer: Medicare HMO

## 2021-06-08 ENCOUNTER — Ambulatory Visit (HOSPITAL_COMMUNITY): Payer: Medicare HMO

## 2021-06-08 ENCOUNTER — Telehealth: Payer: Self-pay | Admitting: *Deleted

## 2021-06-08 DIAGNOSIS — E7800 Pure hypercholesterolemia, unspecified: Secondary | ICD-10-CM

## 2021-06-08 NOTE — Telephone Encounter (Signed)
-----   Message from Burnell Blanks, MD sent at 06/03/2021  5:09 PM EDT ----- LDL is not at goal but on max dose of Lipitor. LFTs are slightly abnormal. I would like to repeat his LFTs in 4 weeks. Gerald Stabs

## 2021-06-08 NOTE — Telephone Encounter (Signed)
Reviewed with patient . He will continue on same dose of Lipitor for now with no other changes and come in to repeat LFTs on 07/01/21.

## 2021-06-10 ENCOUNTER — Ambulatory Visit (HOSPITAL_COMMUNITY): Payer: Medicare HMO

## 2021-06-12 ENCOUNTER — Ambulatory Visit (HOSPITAL_COMMUNITY): Payer: Medicare HMO

## 2021-06-15 ENCOUNTER — Ambulatory Visit (HOSPITAL_COMMUNITY): Payer: Medicare HMO

## 2021-06-17 ENCOUNTER — Ambulatory Visit (HOSPITAL_COMMUNITY): Payer: Medicare HMO

## 2021-06-19 ENCOUNTER — Ambulatory Visit (HOSPITAL_COMMUNITY): Payer: Medicare HMO

## 2021-06-22 ENCOUNTER — Ambulatory Visit (HOSPITAL_COMMUNITY): Payer: Medicare HMO

## 2021-06-24 ENCOUNTER — Ambulatory Visit (HOSPITAL_COMMUNITY): Payer: Medicare HMO

## 2021-06-26 ENCOUNTER — Ambulatory Visit (HOSPITAL_COMMUNITY): Payer: Medicare HMO

## 2021-06-29 ENCOUNTER — Ambulatory Visit (HOSPITAL_COMMUNITY): Payer: Medicare HMO

## 2021-07-01 ENCOUNTER — Ambulatory Visit (HOSPITAL_COMMUNITY): Payer: Medicare HMO

## 2021-07-01 ENCOUNTER — Other Ambulatory Visit: Payer: Medicare HMO | Admitting: *Deleted

## 2021-07-01 ENCOUNTER — Other Ambulatory Visit: Payer: Self-pay

## 2021-07-01 DIAGNOSIS — E7800 Pure hypercholesterolemia, unspecified: Secondary | ICD-10-CM

## 2021-07-01 LAB — HEPATIC FUNCTION PANEL
ALT: 45 IU/L — ABNORMAL HIGH (ref 0–44)
AST: 40 IU/L (ref 0–40)
Albumin: 4 g/dL (ref 3.6–4.6)
Alkaline Phosphatase: 148 IU/L — ABNORMAL HIGH (ref 44–121)
Bilirubin Total: 0.6 mg/dL (ref 0.0–1.2)
Bilirubin, Direct: 0.16 mg/dL (ref 0.00–0.40)
Total Protein: 6.7 g/dL (ref 6.0–8.5)

## 2021-07-03 ENCOUNTER — Ambulatory Visit (HOSPITAL_COMMUNITY): Payer: Medicare HMO

## 2021-07-06 ENCOUNTER — Ambulatory Visit (HOSPITAL_COMMUNITY): Payer: Medicare HMO

## 2021-07-07 ENCOUNTER — Encounter (HOSPITAL_COMMUNITY): Payer: Self-pay

## 2021-07-07 ENCOUNTER — Telehealth (HOSPITAL_COMMUNITY): Payer: Self-pay

## 2021-07-07 NOTE — Telephone Encounter (Signed)
Called and spoke to pt wife about pt participating in cardiac rehab, pt wife stated that pt was not there and that she would pass along the message for him to call us back.   Mailed letter

## 2021-07-08 ENCOUNTER — Ambulatory Visit (HOSPITAL_COMMUNITY): Payer: Medicare HMO

## 2021-07-10 ENCOUNTER — Ambulatory Visit (HOSPITAL_COMMUNITY): Payer: Medicare HMO

## 2021-07-29 NOTE — Telephone Encounter (Signed)
No repsonse from pt.  Closed referral  

## 2021-12-22 NOTE — Progress Notes (Signed)
Chief Complaint  Patient presents with   Follow-up    CAD   History of Present Illness: 81 yo male with history of CAD and HLD here today for cardiac follow up. In 1999 he had a stent placed in the RCA. 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 Echo 08/25/12 with normal LVEF, mild MR, grade 2 diastolic dysfunction. He was admitted to [facility] 02/19/21 with chest pain and ruled in for a NSTEMI. Cardiac cath 02/20/21 per Dr. Ellyn Hack with severe RCA disease. Two drug eluting stents were placed in the RCA and a segment of stent restenosis was treated with cutting balloon angioplasty. Echo 02/20/21 with LVEF=55-60%. No significant valve disease. He was discharged on ASA and Brilinta.   He is here today for follow up. The patient denies any chest pain, dyspnea, palpitations, lower extremity edema, orthopnea, PND, dizziness, near syncope or syncope.   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, 07/25/2008 treated with 2 overlapping DES   Hyperlipidemia    with low high-density lipoprotein   Kidney stone     Past Surgical History:  Procedure Laterality Date   BACK SURGERY     CHOLECYSTECTOMY     CORONARY STENT INTERVENTION N/A 02/20/2021   Procedure: CORONARY STENT INTERVENTION;  Surgeon: Leonie Man, MD;  Location: [place] [facility];  Service: Cardiovascular;  Laterality: N/A;   CORONARY STENT PLACEMENT     2 overlapping Promus DES (covering existing BMS) in RCA: Promus DES 3.5x23 (x 2) -> post-dilated to 3.75  mm   LEFT HEART CATH AND CORONARY ANGIOGRAPHY N/A 02/20/2021   Procedure: LEFT HEART CATH AND CORONARY ANGIOGRAPHY;  Surgeon: Leonie Man, MD;  Location: [place] [facility];   Service: Cardiovascular;  Laterality: N/A;    Current Outpatient Medications  Medication Sig Dispense Refill   aspirin 81 MG tablet Take 81 mg by mouth daily.     atorvastatin (LIPITOR) 40 MG tablet Take 40 mg by mouth daily.     dorzolamide-timolol (COSOPT) 22.3-6.8 MG/ML ophthalmic solution Place 1 drop into both eyes daily.     lisinopril (ZESTRIL) 5 MG tablet Take 5 mg by mouth daily.     LORazepam (ATIVAN) 0.5 MG tablet Take 1 tablet by mouth as needed for sleep.     LUMIGAN 0.01 % SOLN Place 1 drop into both eyes daily.     nitroGLYCERIN (NITROSTAT) 0.4 MG SL tablet Place 1 tablet (0.4 mg total) under the tongue every 5 (five) minutes x 3 doses as needed for chest pain. 25 tablet 2   finasteride (PROSCAR) 5 MG tablet Take 5 mg by mouth daily. (Patient not taking: Reported on 12/23/2021)     mupirocin ointment (BACTROBAN) 2 % Apply 1 application topically daily. (Patient not taking: Reported on 12/23/2021) 22 g 0   pantoprazole (PROTONIX) 40 MG tablet Take 1 tablet (40 mg total) by mouth daily. (Patient not taking: Reported on 12/23/2021) 30 tablet 0   tamsulosin (FLOMAX) 0.4 MG CAPS capsule Take 0.4 mg by mouth. (Patient not taking: Reported on 12/23/2021)     ticagrelor (BRILINTA) 90 MG TABS tablet Take 1 tablet (90 mg total)  by mouth 2 (two) times daily. (Patient not taking: Reported on 12/23/2021) 180 tablet 2   No current facility-administered medications for this visit.    Allergies  Allergen Reactions   Gadolinium Derivatives Hives   Multihance [Gadobenate] Hives    [name]   Iodine Rash    Social History   Socioeconomic History   Marital status: Married    Spouse name: Not on file   Number of children: Not on file   Years of education: Not on file   Highest education level: Not on file  Occupational History   Not on file  Tobacco Use   Smoking status: Never   Smokeless tobacco: Never  Vaping Use   Vaping Use: Never used  Substance and  Sexual Activity   Alcohol use: Not Currently   Drug use: No   Sexual activity: Not on file  Other Topics Concern   Not on file  Social History Narrative   Not on file   Social Determinants of Health   Financial Resource Strain: Not on file  Food Insecurity: Not on file  Transportation Needs: Not on file  Physical Activity: Not on file  Stress: Not on file  Social Connections: Not on file  Intimate Partner Violence: Not on file    No family history on file.  Review of Systems:  As stated in the HPI and otherwise negative.   BP 124/74    Pulse (!) 53    Ht 5\' 11"  (1.803 m)    Wt 164 lb 9.6 oz (74.7 kg)    SpO2 99%    BMI 22.96 kg/m   Physical Examination: General: Well developed, well nourished, NAD  HEENT: OP clear, mucus membranes moist  SKIN: warm, dry. No rashes. Neuro: No focal deficits  Musculoskeletal: Muscle strength 5/5 all ext  Psychiatric: Mood and affect normal  Neck: No JVD, no carotid bruits, no thyromegaly, no lymphadenopathy.  Lungs:Clear bilaterally, no wheezes, rhonci, crackles Cardiovascular: Regular rate and rhythm. No murmurs, gallops or rubs. Abdomen:Soft. Bowel sounds present. Non-tender.  Extremities: No lower extremity edema. Pulses are 2 + in the bilateral DP/PT.  Echo March 2022:  1. Left ventricular ejection fraction, by estimation, is 55 to 60%. The  left ventricle has normal function. The left ventricle demonstrates  regional wall motion abnormalities (see scoring diagram/findings for  description). Left ventricular diastolic  parameters are consistent with Grade I diastolic dysfunction (impaired  relaxation). There is mild hypokinesis of the left ventricular, basal  inferior wall and inferolateral wall.   2. Right ventricular systolic function is normal. The right ventricular  size is normal. There is normal pulmonary artery systolic pressure. The  estimated right ventricular systolic pressure is 25.4 mmHg.   3. The mitral valve is  normal in structure. Trivial mitral valve  regurgitation. No evidence of mitral stenosis.   4. The aortic valve is normal in structure. Aortic valve regurgitation is  not visualized. No aortic stenosis is present.   5. The inferior vena cava is normal in size with greater than 50%  respiratory variability, suggesting right atrial pressure of 3 mmHg.   EKG:  EKG is ordered today. The ekg ordered today demonstrates   Recent Labs: 02/21/2021: BUN 20; Creatinine, Ser 1.16; Hemoglobin 13.3; Platelets 155; Potassium 4.0; Sodium 135 07/01/2021: ALT 45   Lipid Panel    Component Value Date/Time   CHOL 146 06/03/2021 0755   TRIG 121 06/03/2021 0755   HDL 26 (L) 06/03/2021  0755   CHOLHDL 5.6 (H) 06/03/2021 0755   CHOLHDL 5.2 02/20/2021 0240   VLDL 19 02/20/2021 0240   LDLCALC 98 06/03/2021 0755     Wt Readings from Last 3 Encounters:  12/23/21 164 lb 9.6 oz (74.7 kg)  03/04/21 166 lb 3.2 oz (75.4 kg)  02/21/21 166 lb 11.2 oz (75.6 kg)     Other studies Reviewed: Additional studies/ records that were reviewed today include:  Review of the above records demonstrates:   Assessment and Plan:   1. CAD without angina: He has no chest pain. Will continue ASA. He stopped Brilinta last month due to hematuria. Continue statin. No beta blocker due to bradycardia.   2. HYPERLIPIDEMIA: Lipids followed in primary care. Continue statin.  He did not tolerate 80 mg of Lipitor due to elevated LFTs.   3. Mitral regurgitation: Trivial MR by echo in March 2022  Current medicines are reviewed at length with the patient today.  The patient does not have concerns regarding medicines.  The following changes have been made:  no change  Labs/ tests ordered today include:   No orders of the defined types were placed in this encounter.  Disposition:   F/U with me in 12 months   Signed, Lauree Chandler, MD 12/23/2021 8:01 AM    [facility] [place], [place], [place]   [address] Phone: [phone]; Fax: [phone]

## 2021-12-23 ENCOUNTER — Encounter: Payer: Self-pay | Admitting: Cardiovascular Disease

## 2021-12-23 ENCOUNTER — Other Ambulatory Visit: Payer: Self-pay

## 2021-12-23 ENCOUNTER — Ambulatory Visit: Payer: Medicare HMO | Admitting: Cardiovascular Disease

## 2021-12-23 VITALS — BP 124/74 | HR 53 | Ht 71.0 in | Wt 164.6 lb

## 2021-12-23 DIAGNOSIS — E7800 Pure hypercholesterolemia, unspecified: Secondary | ICD-10-CM

## 2021-12-23 DIAGNOSIS — I340 Nonrheumatic mitral (valve) insufficiency: Secondary | ICD-10-CM

## 2021-12-23 DIAGNOSIS — I2510 Atherosclerotic heart disease of native coronary artery without angina pectoris: Secondary | ICD-10-CM

## 2021-12-23 NOTE — Patient Instructions (Signed)
Medication Instructions:  No changes *If you need a refill on your cardiac medications before your next appointment, please call your pharmacy*   Lab Work: none If you have labs (blood work) drawn today and your tests are completely normal, you will receive your results only by: [place] (if you have MyChart) OR A paper copy in the mail If you have any lab test that is abnormal or we need to change your treatment, we will call you to review the results.   Testing/Procedures: none  Follow-Up: At [facility], you and your health needs are our priority.  As part of our continuing mission to provide you with exceptional heart care, we have created designated Provider Care Teams.  These Care Teams include your primary Cardiologist (physician) and Advanced Practice Providers (APPs -  Physician Assistants and Nurse Practitioners) who all work together to provide you with the care you need, when you need it.  We recommend signing up for the patient portal called "MyChart".  Sign up information is provided on this After Visit Summary.  MyChart is used to connect with patients for Virtual Visits (Telemedicine).  Patients are able to view lab/test results, encounter notes, upcoming appointments, etc.  Non-urgent messages can be sent to your provider as well.   To learn more about what you can do with MyChart, go to [email or web address].    Your next appointment:   12 month(s)  The format for your next appointment:   In Person  Provider:   Lauree Chandler, MD

## 2021-12-29 ENCOUNTER — Encounter (HOSPITAL_COMMUNITY): Payer: Self-pay

## 2021-12-29 ENCOUNTER — Other Ambulatory Visit: Payer: Self-pay

## 2021-12-29 ENCOUNTER — Ambulatory Visit (HOSPITAL_COMMUNITY)
Admission: EM | Admit: 2021-12-29 | Discharge: 2021-12-29 | Disposition: A | Discharge status: Home / Self Care | Payer: Medicare HMO | Attending: Internal Medicine | Admitting: Internal Medicine

## 2021-12-29 DIAGNOSIS — R338 Other retention of urine: Secondary | ICD-10-CM | POA: Diagnosis not present

## 2021-12-29 LAB — POCT URINALYSIS DIPSTICK, ED / UC
Bilirubin Urine: NEGATIVE
Glucose, UA: NEGATIVE mg/dL
Ketones, ur: NEGATIVE mg/dL
Leukocytes,Ua: NEGATIVE
Nitrite: NEGATIVE
Protein, ur: NEGATIVE mg/dL
Specific Gravity, Urine: 1.015 (ref 1.005–1.030)
Urobilinogen, UA: 0.2 mg/dL (ref 0.0–1.0)
pH: 6 (ref 5.0–8.0)

## 2021-12-29 MED ORDER — FINASTERIDE 5 MG PO TABS: 5.0000 mg | ORAL_TABLET | Freq: Every day | ORAL | 0 refills | Status: DC

## 2021-12-29 MED ORDER — TAMSULOSIN HCL 0.4 MG PO CAPS: 0.4000 mg | ORAL_CAPSULE | Freq: Every day | ORAL | 0 refills | Status: DC

## 2021-12-29 MED ORDER — LIDOCAINE VISCOUS HCL 2 % MT SOLN
OROMUCOSAL | Status: AC
Start: 2021-12-29 — End: ?
  Filled 2021-12-29: qty 15

## 2021-12-29 NOTE — ED Notes (Signed)
Standard drainage bag changed out into leg bag.

## 2021-12-29 NOTE — Discharge Instructions (Addendum)
Please leave the Foley catheter in place Follow-up with urologist in the week to have the Foley catheter removed Take medications as prescribed Return to the urgent care if you have any further questions about Foley care.

## 2021-12-29 NOTE — ED Triage Notes (Signed)
Pt presents with difficulty urinating and bladder pain since waking up this morning.

## 2021-12-29 NOTE — ED Provider Notes (Addendum)
[facility]    CSN: [id number] Arrival date & time: 12/29/21  1023      History   Chief Complaint Chief Complaint  Patient presents with   Urinary Retention    HPI [name] is a 81 y.o. male with a history of recurrent urinary retention comes to urgent care with a few hours history of inability to urinate and abdominal pain.  Patient's symptoms started early hours of this morning.  No nausea or vomiting.  No dysuria urgency or frequency.  No blood in urine.  Patient denies any fevers or chills.  Patient has been on finasteride and tamsulosin in the past.  He is currently not on any of those medications.  Patient does not remember the name of his urologist.  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, 07/25/2008 treated with 2 overlapping DES   Hyperlipidemia    with low high-density lipoprotein   Kidney stone     Patient Active Problem List   Diagnosis Date Noted   Hypertension 02/21/2021   Presence of drug coated stent in right coronary artery    NSTEMI (non-ST elevated myocardial infarction) ([place]) 02/19/2021   Hyperlipidemia 07/22/2009   Coronary artery disease involving native coronary artery of native heart with unstable angina pectoris ([place]) 07/22/2009    Past Surgical History:  Procedure Laterality Date   BACK SURGERY     CHOLECYSTECTOMY     CORONARY STENT INTERVENTION N/A 02/20/2021   Procedure: CORONARY STENT INTERVENTION;  Surgeon: Leonie Man, MD;  Location: [facility];  Service: Cardiovascular;  Laterality: N/A;   CORONARY STENT PLACEMENT     2 overlapping Promus DES (covering existing BMS) in RCA: Promus DES 3.5x23 (x 2) -> post-dilated to 3.75  mm   LEFT HEART CATH AND CORONARY ANGIOGRAPHY N/A 02/20/2021   Procedure: LEFT HEART CATH AND  CORONARY ANGIOGRAPHY;  Surgeon: Leonie Man, MD;  Location: [facility];  Service: Cardiovascular;  Laterality: N/A;       Home Medications    Prior to Admission medications   Medication Sig Start Date End Date Taking? Authorizing Provider  aspirin 81 MG tablet Take 81 mg by mouth daily.    [provider]  atorvastatin (LIPITOR) 40 MG tablet Take 40 mg by mouth daily.    [provider]  dorzolamide-timolol (COSOPT) 22.3-6.8 MG/ML ophthalmic solution Place 1 drop into both eyes daily. 08/26/17   [provider]  finasteride (PROSCAR) 5 MG tablet Take 1 tablet (5 mg total) by mouth daily. 12/29/21   Jamesmichael Shadd, Myrene Galas, MD  lisinopril (ZESTRIL) 5 MG tablet Take 5 mg by mouth daily.    [provider]  LORazepam (ATIVAN) 0.5 MG tablet Take 1 tablet by mouth as needed for sleep. 10/18/18   [provider]  LUMIGAN 0.01 % SOLN Place 1 drop into both eyes daily. 10/17/17   [provider]  nitroGLYCERIN (NITROSTAT) 0.4 MG SL tablet Place 1 tablet (0.4 mg total) under the tongue every 5 (five) minutes x 3 doses as needed for chest pain. 02/21/21   Cheryln Manly, NP  tamsulosin (FLOMAX) 0.4 MG CAPS capsule Take 1 capsule (0.4 mg total) by mouth daily. 12/29/21   Doyle Kunath, Myrene Galas, MD    Family History History reviewed.  No pertinent family history.  Social History Social History   Tobacco Use   Smoking status: Never   Smokeless tobacco: Never  Vaping Use   Vaping Use: Never used  Substance Use Topics   Alcohol use: Not Currently   Drug use: No     Allergies   Gadolinium derivatives, Multihance [gadobenate], and Iodine   Review of Systems Review of Systems  HENT: Negative.    Respiratory: Negative.    Gastrointestinal:  Positive for abdominal pain.  Genitourinary:  Positive for difficulty urinating. Negative for dysuria, hematuria, penile discharge, penile pain and scrotal swelling.  Musculoskeletal: Negative.      Physical Exam Triage Vital Signs ED Triage Vitals [12/29/21 1035]  Enc Vitals Group     BP (!) 176/99     Pulse Rate (!) 58     Resp 18     Temp 97.7 F (36.5 C)     Temp Source Oral     SpO2 100 %     Weight      Height      Head Circumference      Peak Flow      Pain Score 8     Pain Loc      Pain Edu?      Excl. in [place]?    No data found.  Updated Vital Signs BP (!) 176/99 (BP Location: Right Arm)    Pulse (!) 58    Temp 97.7 F (36.5 C) (Oral)    Resp 18    SpO2 100%   Visual Acuity Right Eye Distance:   Left Eye Distance:   Bilateral Distance:    Right Eye Near:   Left Eye Near:    Bilateral Near:     Physical Exam Vitals and nursing note reviewed. Exam conducted with a chaperone present.  Constitutional:      General: He is in acute distress.     Appearance: He is ill-appearing.  Cardiovascular:     Rate and Rhythm: Normal rate and regular rhythm.     Pulses: Normal pulses.  Pulmonary:     Effort: Pulmonary effort is normal.     Breath sounds: Normal breath sounds.  Abdominal:     General: Bowel sounds are normal.     Palpations: Abdomen is soft.  Genitourinary:    Penis: Normal.      Testes: Normal.  Neurological:     Mental Status: He is alert.     UC Treatments / Results  Labs (all labs ordered are listed, but only abnormal results are displayed) Labs Reviewed  POCT URINALYSIS DIPSTICK, ED / UC - Abnormal; Notable for the following components:      Result Value   Hgb urine dipstick MODERATE (*)    All other components within normal limits    EKG   Radiology No results found.  Procedures Procedures (including critical care time)  Medications Ordered in UC Medications - No data to display  Initial Impression / Assessment and Plan / UC Course  I have reviewed the triage vital signs and the nursing notes.  Pertinent labs & imaging results that were available during my care of the patient were reviewed by me and considered in  my medical decision making (see chart for details).     1.  Acute retention of urine: Foley catheter ([place] 16) was placed under sterile conditions. Point-of-care urine was positive for hemoglobin. Restart finasteride and tamsulosin Patient advised to leave the Foley in place. You need  to follow-up with the urologist in 7-120 days with a urologist . Final Clinical Impressions(s) / UC Diagnoses   Final diagnoses:  Acute retention of urine     Discharge Instructions      Please leave the Foley catheter in place Follow-up with urologist in the week to have the Foley catheter removed Take medications as prescribed Return to the urgent care if you have any further questions about Foley care.   ED Prescriptions     Medication Sig Dispense Auth. Provider   finasteride (PROSCAR) 5 MG tablet Take 1 tablet (5 mg total) by mouth daily. 90 tablet Alyssia Heese, Myrene Galas, MD   tamsulosin (FLOMAX) 0.4 MG CAPS capsule Take 1 capsule (0.4 mg total) by mouth daily. 90 capsule Velmer Broadfoot, Myrene Galas, MD      PDMP not reviewed this encounter.   Chase Picket, MD 12/29/21 1210    Chase Picket, MD 12/29/21 1210

## 2022-06-09 ENCOUNTER — Encounter (HOSPITAL_COMMUNITY): Payer: Self-pay | Admitting: Emergency Medicine

## 2022-06-09 ENCOUNTER — Other Ambulatory Visit: Payer: Self-pay

## 2022-06-09 ENCOUNTER — Emergency Department (HOSPITAL_COMMUNITY): Payer: Medicare HMO

## 2022-06-09 ENCOUNTER — Emergency Department (HOSPITAL_COMMUNITY)
Admission: EM | Admit: 2022-06-09 | Discharge: 2022-06-09 | Discharge status: Against Medical Advice | Payer: Medicare HMO | Attending: Emergency Medicine | Admitting: Emergency Medicine

## 2022-06-09 DIAGNOSIS — M79602 Pain in left arm: Secondary | ICD-10-CM | POA: Insufficient documentation

## 2022-06-09 DIAGNOSIS — Z5321 Procedure and treatment not carried out due to patient leaving prior to being seen by health care provider: Secondary | ICD-10-CM | POA: Diagnosis not present

## 2022-06-09 DIAGNOSIS — R202 Paresthesia of skin: Secondary | ICD-10-CM | POA: Insufficient documentation

## 2022-06-09 DIAGNOSIS — M791 Myalgia, unspecified site: Secondary | ICD-10-CM | POA: Diagnosis present

## 2022-06-09 LAB — CBC WITH DIFFERENTIAL/PLATELET
Abs Immature Granulocytes: 0.07 10*3/uL (ref 0.00–0.07)
Basophils Absolute: 0 10*3/uL (ref 0.0–0.1)
Basophils Relative: 0 %
Eosinophils Absolute: 0.2 10*3/uL (ref 0.0–0.5)
Eosinophils Relative: 2 %
HCT: 40.6 % (ref 39.0–52.0)
Hemoglobin: 12.9 g/dL — ABNORMAL LOW (ref 13.0–17.0)
Immature Granulocytes: 1 %
Lymphocytes Relative: 11 %
Lymphs Abs: 0.8 10*3/uL (ref 0.7–4.0)
MCH: 29.9 pg (ref 26.0–34.0)
MCHC: 31.8 g/dL (ref 30.0–36.0)
MCV: 94.2 fL (ref 80.0–100.0)
Monocytes Absolute: 0.7 10*3/uL (ref 0.1–1.0)
Monocytes Relative: 9 %
Neutro Abs: 6 10*3/uL (ref 1.7–7.7)
Neutrophils Relative %: 77 %
Platelets: 270 10*3/uL (ref 150–400)
RBC: 4.31 MIL/uL (ref 4.22–5.81)
RDW: 13.2 % (ref 11.5–15.5)
WBC: 7.8 10*3/uL (ref 4.0–10.5)
nRBC: 0 % (ref 0.0–0.2)

## 2022-06-09 LAB — COMPREHENSIVE METABOLIC PANEL
ALT: 313 U/L — ABNORMAL HIGH (ref 0–44)
AST: 193 U/L — ABNORMAL HIGH (ref 15–41)
Albumin: 2.9 g/dL — ABNORMAL LOW (ref 3.5–5.0)
Alkaline Phosphatase: 491 U/L — ABNORMAL HIGH (ref 38–126)
Anion gap: 10 (ref 5–15)
BUN: 17 mg/dL (ref 8–23)
CO2: 24 mmol/L (ref 22–32)
Calcium: 9 mg/dL (ref 8.9–10.3)
Chloride: 102 mmol/L (ref 98–111)
Creatinine, Ser: 1.11 mg/dL (ref 0.61–1.24)
GFR, Estimated: >60 mL/min (ref >60)
Glucose, Bld: 134 mg/dL — ABNORMAL HIGH (ref 70–99)
Potassium: 4.4 mmol/L (ref 3.5–5.1)
Sodium: 136 mmol/L (ref 135–145)
Total Bilirubin: 1.6 mg/dL — ABNORMAL HIGH (ref 0.3–1.2)
Total Protein: 7.4 g/dL (ref 6.5–8.1)

## 2022-06-09 LAB — TROPONIN I (HIGH SENSITIVITY): Troponin I (High Sensitivity): 14 ng/L (ref <18)

## 2022-06-09 NOTE — ED Notes (Signed)
Patient states the wait is too long and he will follow up with his heart doctor in the morning.

## 2022-06-09 NOTE — ED Provider Triage Note (Signed)
Emergency Medicine Provider Triage Evaluation Note  [name] , a 81 y.o. male  was evaluated in triage.  Pt complains of generalized myalgia, pain/tingling sensation to left upper arm.  Patient reports that he has been dealing with the symptoms intermittently over the last 3 weeks.  Pain is located throughout his entire body but left to his left upper arm.  Patient reports that certain positions make the left arm pain worse.  Patient also reports that he has felt some tingling sensation to his left upper arm.  Patient denies any recent falls or traumatic injuries.  Denies any fever, chills, chest pain, shortness of breath, lightheadedness, syncope  Review of Systems  Positive: See above Negative: See above  Physical Exam  BP 125/85   Pulse 86   Temp 98.9 F (37.2 C) (Oral)   Resp 18   SpO2 100%  Gen:   Awake, no distress   Resp:  Normal effort  MSK:   , patient moves all limbs equally without difficulty, no midline tenderness or deformity to cervical, thoracic, lumbar spine Other:  +2 radial pulse bilaterally  Medical Decision Making  Medically screening exam initiated at 5:21 PM.  Appropriate orders placed.  Zaydin Billey Lincks was informed that the remainder of the evaluation will be completed by another provider, this initial triage assessment does not replace that evaluation, and the importance of remaining in the ED until their evaluation is complete.     Loni Beckwith, [place] 06/09/22 1722

## 2022-06-09 NOTE — ED Triage Notes (Signed)
Patient complains of left arm pain that started three weeks ago that waxes and wanes. Denies shortness of breath, denies recent injuries. Patient is alert, speaking in complete sentences, and is in no apparent distress at this time.

## 2022-06-25 NOTE — Progress Notes (Unsigned)
Chief Complaint  Patient presents with   Establish Care    Going to the bathroom frequently, weight loss, tingling in arms going down, having chills, and unable to sleep.   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 male, who is here today with her daughter to establish care.  Former PCP: Dr. Martina Sinner Last preventive routine visit: 04/20/21  Chronic medical problems: HLD,HTN,DM II,insomnia, BPH,depression,and anxiety among some. He follows with cardiologist regularly, Dr Angelena Form.  HLD: He is on Atorvastatin 40 mg daily. Tolerating medications well. Home visit and PAD "screening" abnormal. He denies claudication like symptoms. He has leg pain at rest but no with ambulation. He has not noted edema or ulcers. Left LE 0.91 and right LE 1.17. Reported as "normal range."  11/26/21:  Specimen:  Blood  Ref Range & Units 7 mo ago Comments  Cholesterol, Total 100 - 199 mg/dL 251 High     Triglycerides 0 - 149 mg/dL 140    HDL >39 mg/dL 28 Low     VLDL Cholesterol Cal 5 - 40 mg/dL 26    LDL 0 - 99 mg/dL 197 High     Comment  Comment     HTN on Lisinopril 5 mg daily. He does not check BP regularly.  Lab Results  Component Value Date   CREATININE 1.11 06/09/2022   BUN 17 06/09/2022   NA 136 06/09/2022   K 4.4 06/09/2022   CL 102 06/09/2022   CO2 24 06/09/2022   Today he is c/o a month of intermittent upper abdominal pain, mainly LUQ pain,and diarrhea. Symptoms are exacerbated by food intake, so he is avoid eating and therefore losing wt. Black stools until a few days ago. Negative for heartburn,N/V,or urinary symptoms. Today he has not had a bowel movement. + Gas. Negative for fever. He was in the ED on 06/09/22, left w/o being seen after 4 hours waiting. Labs and imaging were done after triage. Component     Latest Ref Rng 06/09/2022  WBC     4.0 - 10.5 K/uL 7.8   RBC     4.22 - 5.81 Mil/uL 4.31   Hemoglobin     13.0 - 17.0 g/dL 12.9 (L)   HCT     39.0 - 52.0 % 40.6   MCV      78.0 - 100.0 fl 94.2   MCH     26.0 - 34.0 pg 29.9   MCHC     30.0 - 36.0 g/dL 31.8   RDW     11.5 - 15.5 % 13.2   Platelets     150.0 - 400.0 K/uL 270   nRBC     0.0 - 0.2 % 0.0   Neutrophils     % 77   NEUT#     1.7 - 7.7 K/uL 6.0   Lymphocytes     % 11   Lymphocyte #     0.7 - 4.0 K/uL 0.8   Monocytes Relative     % 9   Monocyte #     0.1 - 1.0 K/uL 0.7   Eosinophil     % 2   Eosinophils Absolute     0.0 - 0.5 K/uL 0.2   Basophil     % 0   Basophils Absolute     0.0 - 0.1 K/uL 0.0   Immature Granulocytes     % 1   Abs Immature Granulocytes     0.00 - 0.07 K/uL 0.07  Abnormal LFT's. Negative for current alcohol intake but acknowledges drinking " a lot of beer" years ago.   Lab Results  Component Value Date   ALT 313 (H) 06/09/2022   AST 193 (H) 06/09/2022   ALKPHOS 491 (H) 06/09/2022   BILITOT 1.6 (H) 06/09/2022   Tingling sensation of LUE and from right shoulder to left shoulder. Problem is constant, no associated neck pain. Some mild weakness LUE. Right handed. He has not noted skin rash, erythema,edema,or cyanosis.  Cervical CT on 06/09/22:  1. No acute fracture or traumatic subluxation of the cervical spine.  2. Multilevel degenerative changes as described above with multilevel neural foraminal stenosis.   Last HgA1C on 04/20/21, 5.8. States that he was told he was "borderline."  Insomnia: This is a chronic problem. He takes Lorazepam 0.5 mg daily at bedtime. Sleeping about 2-3 hours at the time.  Review of Systems  Constitutional:  Positive for chills, fatigue and unexpected weight change.  HENT:  Negative for nosebleeds, sore throat and trouble swallowing.   Eyes:  Negative for redness and visual disturbance.  Respiratory:  Negative for cough, shortness of breath and wheezing.   Cardiovascular:  Negative for chest pain and palpitations.  Gastrointestinal:  Negative for abdominal distention and blood in stool.  Endocrine: Negative for  cold intolerance, heat intolerance, polydipsia, polyphagia and polyuria.  Genitourinary:  Negative for decreased urine volume, dysuria and hematuria.  Neurological:  Negative for syncope, facial asymmetry and headaches.  Psychiatric/Behavioral:  Positive for sleep disturbance. Negative for confusion and hallucinations.   Rest see pertinent positives and negatives per HPI.  Current Outpatient Medications on File Prior to Visit  Medication Sig Dispense Refill   aspirin 81 MG tablet Take 81 mg by mouth daily.     atorvastatin (LIPITOR) 40 MG tablet Take 40 mg by mouth daily.     BRILINTA 90 MG TABS tablet Take 90 mg by mouth 2 (two) times daily.     dorzolamide-timolol (COSOPT) 22.3-6.8 MG/ML ophthalmic solution Place 1 drop into both eyes daily.     finasteride (PROSCAR) 5 MG tablet Take 1 tablet (5 mg total) by mouth daily. 90 tablet 0   lisinopril (ZESTRIL) 5 MG tablet Take 5 mg by mouth daily.     LORazepam (ATIVAN) 0.5 MG tablet Take 1 tablet by mouth as needed for sleep.     LUMIGAN 0.01 % SOLN Place 1 drop into both eyes daily.     nitroGLYCERIN (NITROSTAT) 0.4 MG SL tablet Place 1 tablet (0.4 mg total) under the tongue every 5 (five) minutes x 3 doses as needed for chest pain. 25 tablet 2   tamsulosin (FLOMAX) 0.4 MG CAPS capsule Take 1 capsule (0.4 mg total) by mouth daily. 90 capsule 0   No current facility-administered medications on file prior to visit.   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 No pertinent family  history.  Social History   Socioeconomic History   Marital status: Married    Spouse name:  Not on file   Number of children: Not on file   Years of education: Not on file   Highest education level: Not on file  Occupational History   Not on file  Tobacco Use   Smoking status: Never   Smokeless tobacco: Never  Vaping Use   Vaping Use: Never used  Substance and Sexual Activity   Alcohol use: Not Currently   Drug use: No   Sexual activity: Not on file  Other Topics Concern   Not on file  Social History Narrative   Not on file   Social Determinants of Health   Financial Resource Strain: Not on file  Food Insecurity: Not on file  Transportation Needs: Not on file  Physical Activity: Not on file  Stress: Not on file  Social Connections: Not on file   Vitals:   06/28/22 0755  BP: 128/78  Pulse: 72  Resp: 16  SpO2: 99%   Wt Readings from Last 3 Encounters:  06/28/22 155 lb 2 oz (70.4 kg)  12/23/21 164 lb 9.6 oz (74.7 kg)  03/04/21 166 lb 3.2 oz (75.4 kg)   Body mass index is 21.64 kg/m.  Physical Exam Nursing note reviewed.  Constitutional:      General: He is not in acute distress.    Appearance: He is well-developed.  HENT:     Head: Normocephalic and atraumatic.     Mouth/Throat:     Dentition: Has dentures.  Eyes:     Conjunctiva/sclera: Conjunctivae normal.  Cardiovascular:     Rate and Rhythm: Normal rate and regular rhythm.     Pulses:          Dorsalis pedis pulses are 2+ on the right side and 2+ on the left side.     Heart sounds: No murmur heard. Pulmonary:     Effort: Pulmonary effort is normal. No respiratory distress.     Breath sounds: Normal breath sounds.  Abdominal:     Palpations: Abdomen is soft. There is no hepatomegaly or mass.     Tenderness: There is abdominal tenderness in the left upper quadrant.  Genitourinary:    Rectum: Guaiac result positive. No mass or tenderness.  Lymphadenopathy:     Cervical: No cervical adenopathy.      Upper Body:     Right upper body: No supraclavicular adenopathy.     Left upper body: No supraclavicular adenopathy.  Skin:    General: Skin is warm.     Findings: No erythema or rash.  Neurological:     Mental Status: He is alert and oriented to person, place, and time.     Cranial Nerves: No cranial nerve deficit.     Comments: Mildly unstable gait, not assisted.  Psychiatric:     Comments: Well groomed, good eye contact.   ASSESSMENT AND PLAN:  [name] was seen today for establish care.  Diagnoses and all orders for this visit: Orders Placed This Encounter  Procedures   CT Abdomen Pelvis W Contrast   CBC   Vitamin B12   Hemoglobin A1c   Ambulatory referral to Gastroenterology   Lab Results  Component Value Date   HGBA1C 6.1 06/28/2022   Lab Results  Component Value Date   WBC 7.1 06/28/2022   HGB 13.4 06/28/2022   HCT 40.6 06/28/2022   MCV 92.4 06/28/2022   PLT 225.0 06/28/2022   Lab Results  Component Value Date   VITAMINB12 1,499 (H) 06/28/2022   LUQ abdominal pain Upper abdomen,mainly LUQ. We  discussed possible causes. No signs of acute abdomen today. Instructed about warning signs. ? PUD PPI started today. Abdominal CT will be arranged.  Upper GI bleed Melena until a few days ago, positive guaiac test. Recommend starting PPI, Omeprazole 40 mg. GERD precautions. Instructed about warning signs. No changes in Aspirin or Brilinta for now. GI referral placed. Instructed about warning signs.  -     omeprazole (PRILOSEC) 40 MG capsule; Take 1 capsule (40 mg total) by mouth daily.  Numbness and tingling of left upper extremity We discussed possible etiologies. Hx suggest radicular pain. He agrees with trying Gabapentin at bedtime, starting with 100 mg and increasing dose to 200 mg in 10 days if well tolerated. Side effects discussed.  -     gabapentin (NEURONTIN) 100 MG capsule; Take 2 capsules (200 mg total) by mouth at bedtime.  Abnormal  LFTs Since 05/2021 and gradually getting worse. Given his age and other reported symptoms a more serious process needs to be considered. ALT went tup from 45 to 313, so will recommend stopping Atorvastatin until next visit, when se can plan on re-checking LFT's. Continue avoiding alcohol intake. Abdominal CT has been ordered.  Hypertension BP adequately controlled. Continue Lisinopril 5 mg daily and low salt diet.  Insomnia Gabapentin added today may help with this problem. Continue Lorazepam 0.5 mg at bedtime as needed. Good sleep hygiene.  Prediabetes Consistency with a healthful diet to continue for diabetes prevention. Further recommendations according to HgA1C result.  I spent a total of 62 minutes in both face to face and non face to face activities for this visit on the date of this encounter. During this time history was obtained and documented, examination was performed, prior labs/imaging reviewed, and assessment/plan discussed.  Return in about 2 months (around 08/28/2022).  Keith Pile G. [place], MD  [facility]. [place] office.

## 2022-06-28 ENCOUNTER — Telehealth: Payer: Self-pay | Admitting: Family Medicine

## 2022-06-28 ENCOUNTER — Encounter: Payer: Self-pay | Admitting: Family Medicine

## 2022-06-28 ENCOUNTER — Ambulatory Visit (INDEPENDENT_AMBULATORY_CARE_PROVIDER_SITE_OTHER): Payer: Medicare HMO | Admitting: Family Medicine

## 2022-06-28 VITALS — BP 128/78 | HR 72 | Resp 16 | Ht 71.0 in | Wt 155.1 lb

## 2022-06-28 DIAGNOSIS — I10 Essential (primary) hypertension: Secondary | ICD-10-CM | POA: Diagnosis not present

## 2022-06-28 DIAGNOSIS — R7303 Prediabetes: Secondary | ICD-10-CM | POA: Diagnosis not present

## 2022-06-28 DIAGNOSIS — R200 Anesthesia of skin: Secondary | ICD-10-CM | POA: Diagnosis not present

## 2022-06-28 DIAGNOSIS — G4700 Insomnia, unspecified: Secondary | ICD-10-CM

## 2022-06-28 DIAGNOSIS — R7989 Other specified abnormal findings of blood chemistry: Secondary | ICD-10-CM

## 2022-06-28 DIAGNOSIS — R202 Paresthesia of skin: Secondary | ICD-10-CM

## 2022-06-28 DIAGNOSIS — R1012 Left upper quadrant pain: Secondary | ICD-10-CM

## 2022-06-28 DIAGNOSIS — K922 Gastrointestinal hemorrhage, unspecified: Secondary | ICD-10-CM | POA: Diagnosis not present

## 2022-06-28 LAB — CBC
HCT: 40.6 % (ref 39.0–52.0)
Hemoglobin: 13.4 g/dL (ref 13.0–17.0)
MCHC: 33.1 g/dL (ref 30.0–36.0)
MCV: 92.4 fl (ref 78.0–100.0)
Platelets: 225 10*3/uL (ref 150.0–400.0)
RBC: 4.39 Mil/uL (ref 4.22–5.81)
RDW: 15.2 % (ref 11.5–15.5)
WBC: 7.1 10*3/uL (ref 4.0–10.5)

## 2022-06-28 LAB — VITAMIN B12: Vitamin B-12: 1499 pg/mL — ABNORMAL HIGH (ref 211–911)

## 2022-06-28 LAB — HEMOGLOBIN A1C: Hgb A1c MFr Bld: 6.1 % (ref 4.6–6.5)

## 2022-06-28 MED ORDER — GABAPENTIN 100 MG PO CAPS: 200.0000 mg | ORAL_CAPSULE | Freq: Every day | ORAL | 1 refills | Status: DC

## 2022-06-28 MED ORDER — OMEPRAZOLE 40 MG PO CPDR: 40.0000 mg | DELAYED_RELEASE_CAPSULE | Freq: Every day | ORAL | 1 refills | Status: DC

## 2022-06-28 NOTE — Telephone Encounter (Signed)
Pt's daughter called to say Pt is allergic to the dye that they will use for imaging on Friday.   Daughter  [phone]  Can Md prescribe something?  Please send to  [id number]- [place], [place] - [address]

## 2022-06-28 NOTE — Assessment & Plan Note (Signed)
Gabapentin added today may help with this problem. Continue Lorazepam 0.5 mg at bedtime as needed. Good sleep hygiene.

## 2022-06-28 NOTE — Assessment & Plan Note (Signed)
Consistency with a healthful diet to continue for diabetes prevention. Further recommendations according to HgA1C result.

## 2022-06-28 NOTE — Assessment & Plan Note (Signed)
BP adequately controlled. Continue Lisinopril 5 mg daily and low salt diet. 

## 2022-06-28 NOTE — Patient Instructions (Addendum)
A few things to remember from today's visit:   PAD (peripheral artery disease) ([place])  LUQ abdominal pain - Plan: Ambulatory referral to Gastroenterology, CT Abdomen Pelvis W Contrast  Upper GI bleed - Plan: omeprazole (PRILOSEC) 40 MG capsule, Ambulatory referral to Gastroenterology  Numbness and tingling of left upper extremity - Plan: gabapentin (NEURONTIN) 100 MG capsule  If you need refills please call your pharmacy. Do not use My Chart to request refills or for acute issues that need immediate attention.   Gabapentin 100 mg at bedtime, increase to 2 caps in 10 days. Omeprazole 30 min before breakfast for possible stomach ulcer.  CT of abdomen is going to be arranged. No changes in rest of medications.  Please be sure medication list is accurate. If a new problem present, please set up appointment sooner than planned today.

## 2022-06-29 ENCOUNTER — Encounter (HOSPITAL_BASED_OUTPATIENT_CLINIC_OR_DEPARTMENT_OTHER): Payer: Self-pay

## 2022-06-29 NOTE — Addendum Note (Signed)
Addended by: Rodrigo Ran on: 06/29/2022 08:33 AM   Modules accepted: Orders

## 2022-06-29 NOTE — Telephone Encounter (Signed)
Per pcp, okay to change to without contrast. Message sent to [facility] scheduling to let them know.

## 2022-07-02 ENCOUNTER — Ambulatory Visit (HOSPITAL_BASED_OUTPATIENT_CLINIC_OR_DEPARTMENT_OTHER)
Admission: RE | Admit: 2022-07-02 | Discharge: 2022-07-02 | Disposition: A | Discharge status: Home / Self Care | Payer: Medicare HMO | Source: Ambulatory Visit | Attending: Family Medicine | Admitting: Family Medicine

## 2022-07-02 DIAGNOSIS — R1012 Left upper quadrant pain: Secondary | ICD-10-CM | POA: Insufficient documentation

## 2022-07-13 ENCOUNTER — Encounter: Payer: Self-pay | Admitting: Gastroenterology

## 2022-08-13 ENCOUNTER — Encounter: Payer: Self-pay | Admitting: Gastroenterology

## 2022-08-13 ENCOUNTER — Other Ambulatory Visit (INDEPENDENT_AMBULATORY_CARE_PROVIDER_SITE_OTHER): Payer: Medicare HMO

## 2022-08-13 ENCOUNTER — Ambulatory Visit: Payer: Medicare HMO | Admitting: Gastroenterology

## 2022-08-13 VITALS — BP 112/72 | HR 77 | Ht 71.0 in | Wt 157.0 lb

## 2022-08-13 DIAGNOSIS — R634 Abnormal weight loss: Secondary | ICD-10-CM | POA: Diagnosis not present

## 2022-08-13 DIAGNOSIS — R197 Diarrhea, unspecified: Secondary | ICD-10-CM | POA: Diagnosis not present

## 2022-08-13 DIAGNOSIS — R109 Unspecified abdominal pain: Secondary | ICD-10-CM

## 2022-08-13 DIAGNOSIS — R748 Abnormal levels of other serum enzymes: Secondary | ICD-10-CM

## 2022-08-13 DIAGNOSIS — R1012 Left upper quadrant pain: Secondary | ICD-10-CM | POA: Diagnosis not present

## 2022-08-13 DIAGNOSIS — R932 Abnormal findings on diagnostic imaging of liver and biliary tract: Secondary | ICD-10-CM

## 2022-08-13 DIAGNOSIS — Z955 Presence of coronary angioplasty implant and graft: Secondary | ICD-10-CM

## 2022-08-13 LAB — PROTIME-INR
INR: 1.1 ratio — ABNORMAL HIGH (ref 0.8–1.0)
Prothrombin Time: 12.1 s (ref 9.6–13.1)

## 2022-08-13 LAB — TSH: TSH: 2.07 u[IU]/mL (ref 0.35–5.50)

## 2022-08-13 LAB — HEPATIC FUNCTION PANEL
ALT: 55 U/L — ABNORMAL HIGH (ref 0–53)
AST: 62 U/L — ABNORMAL HIGH (ref 0–37)
Albumin: 3.4 g/dL — ABNORMAL LOW (ref 3.5–5.2)
Alkaline Phosphatase: 490 U/L — ABNORMAL HIGH (ref 39–117)
Bilirubin, Direct: 0.8 mg/dL — ABNORMAL HIGH (ref 0.0–0.3)
Total Bilirubin: 1.8 mg/dL — ABNORMAL HIGH (ref 0.2–1.2)
Total Protein: 7.3 g/dL (ref 6.0–8.3)

## 2022-08-13 LAB — IBC + FERRITIN
Ferritin: 131.7 ng/mL (ref 22.0–322.0)
Iron: 96 ug/dL (ref 42–165)
Saturation Ratios: 36.9 % (ref 20.0–50.0)
TIBC: 260.4 ug/dL (ref 250.0–450.0)
Transferrin: 186 mg/dL — ABNORMAL LOW (ref 212.0–360.0)

## 2022-08-13 LAB — VITAMIN D 25 HYDROXY (VIT D DEFICIENCY, FRACTURES): VITD: 29.05 ng/mL — ABNORMAL LOW (ref 30.00–100.00)

## 2022-08-13 LAB — GAMMA GT: GGT: 985 U/L — ABNORMAL HIGH (ref 7–51)

## 2022-08-13 LAB — C-REACTIVE PROTEIN: CRP: 3.4 mg/dL (ref 0.5–20.0)

## 2022-08-13 MED ORDER — LOPERAMIDE HCL 2 MG PO TABS: 2.0000 mg | ORAL_TABLET | Freq: Three times a day (TID) | ORAL | 3 refills | Status: DC | PRN

## 2022-08-13 NOTE — Progress Notes (Signed)
HPI : [name] is a pleasant 81 year old male with a history of coronary artery disease status post stent placement most recently in March 2022 who is referred to [place] by Dr. Betty Martinique for further evaluation of left upper abdominal pain, elevated liver enzymes and abnormal liver imaging.  The patient states he is also very bothered by persistent diarrhea.  The patient states that he has been having problems with significant diarrhea for the past 2 months now.  The patient's wife states that he has had issues with diarrhea not as severe for at least a year.  Currently, the patient reports that he has numerous bowel movements in the morning, then several more following lunch, and then several more at night, which interferes with his sleep.  He thinks on average he has between 12-16 bowel movements per day.  His stool consistency is frequently watery ([place] 7), frequently associated with urgency and he has had numerous incontinent episodes.  Abdominal pain is not a significant problem for him.  Constipation is never a problem for him.  He reports that historically he has never had any problems with diarrhea or his bowel habits in general. He has seen bright red blood on occasion in the stool, last time about a month ago.  He does report significant perianal irritation from the excessive bowel movements.  He also has been having intermittent left upper quadrant abdominal pain.  This seems to have subsided, but was very bothersome a few months ago.  He was started on omeprazole for this pain, and he thinks that this helped.  He states that it also helped with his diarrhea temporarily, but that it has returned to its previous level of severity.  He reports unintentional weight loss over the past year or so.  He reports losing 20 pounds.  Review of the records and epic estimate an approximate 7 to 10 pound weight loss over the past year.  Although abdominal pain has not been an issue, he does report  issues with feeling full and loss of appetite.  He denies dysphagia  A CT scan of the abdomen pelvis without contrast was performed last month.  CT showed a shrunken left lobe and suspected hypertrophy of the right lobe.  No other changes of portal hypertension were seen. IV contrast was not used as the patient has a reported allergy to iodine, as well as gadolinium. The patient thinks that the iodine allergy was reported many years ago when he had hives.  He thinks that this followed his initial cardiac catheterization. The patient underwent a cardiac catheterization in March of last year in which several drug-eluting stents were placed.  The procedure report indicates that 160 mL of iohexol were used during the catheterization.  There were no notes in the EMR to indicate that he had any sort of allergic reaction, and the patient denies having any sort of hives or other allergic reaction symptoms around that time.  The patient was noted to have significantly elevated liver enzymes in a mixed pattern, with alk phos close to 500, ALT 300 and slightly elevated bilirubin at 1.4.His liver enzymes have been modestly elevated to a year before with mild ALT and alk phos elevation.  Prior to that, he had had normal liver enzymes. The patient reports drinking heavily for many years, but stopped drinking 2 or 3 years ago.  The patient was not able to quantify how many beers he drinks per day on average, but estimates it was at least  6.  He has a history of coronary artery disease with stents in the past, and was admitted in March 2022 for an NSTEMI, and underwent further stent placement.  He was on aspirin and Brilinta, but stopped taking Brilinta after several months because of hematuria.  He saw his cardiologist in January of this year who did not recommend any further antiplatelet agents, recommending aspirin therapy alone.  He was recommended follow-up in January 2024.  He had a normal EF by echo last March,  with grade 1 diastolic dysfunction. The patient denies any concerning symptoms at this time.  He is active at home, and will typically walk for at least 30 minutes with no symptoms of shortness of breath, chest pressure/pain/lightheadedness.  He has not been walking as much recently, because of his diarrhea.  He has never had an upper or lower endoscopy. He has no family history of GI malignancy.   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, 07/25/2008 treated with 2 overlapping DES   Hyperlipidemia    with low high-density lipoprotein   Kidney stone      Past Surgical History:  Procedure Laterality Date   BACK SURGERY     CHOLECYSTECTOMY     CORONARY STENT INTERVENTION N/A 02/20/2021   Procedure: CORONARY STENT INTERVENTION;  Surgeon: Leonie Man, MD;  Location: [facility];  Service: Cardiovascular;  Laterality: N/A;   CORONARY STENT PLACEMENT     2 overlapping Promus DES (covering existing BMS) in RCA: Promus DES 3.5x23 (x 2) -> post-dilated to 3.75  mm   LEFT HEART CATH AND CORONARY ANGIOGRAPHY N/A 02/20/2021   Procedure: LEFT HEART CATH AND CORONARY ANGIOGRAPHY;  Surgeon: Leonie Man, MD;  Location: [facility];  Service: Cardiovascular;  Laterality: N/A;   No family history on file. Social History   Tobacco Use   Smoking status: Never   Smokeless tobacco: Never  Vaping Use   Vaping Use: Never used  Substance Use Topics   Alcohol use: Not Currently   Drug use: No   Current Outpatient Medications  Medication Sig Dispense Refill   aspirin 81 MG tablet Take 81 mg by mouth daily.     atorvastatin (LIPITOR) 40 MG tablet Take 40 mg by mouth daily.     BRILINTA 90 MG TABS tablet Take 90 mg by mouth 2 (two) times daily.     dorzolamide-timolol (COSOPT) 22.3-6.8 MG/ML  ophthalmic solution Place 1 drop into both eyes daily.     finasteride (PROSCAR) 5 MG tablet Take 1 tablet (5 mg total) by mouth daily. 90 tablet 0   gabapentin (NEURONTIN) 100 MG capsule Take 2 capsules (200 mg total) by mouth at bedtime. 60 capsule 1   lisinopril (ZESTRIL) 5 MG tablet Take 5 mg by mouth daily.     LORazepam (ATIVAN) 0.5 MG tablet Take 1 tablet by mouth as needed for sleep.     LUMIGAN 0.01 % SOLN Place 1 drop into both eyes daily.     nitroGLYCERIN (NITROSTAT) 0.4 MG SL tablet Place 1 tablet (0.4 mg total) under the tongue every 5 (five) minutes x 3 doses as needed for chest pain. 25 tablet 2   omeprazole (PRILOSEC) 40 MG capsule Take 1 capsule (40 mg total) by mouth daily. 30 capsule 1  tamsulosin (FLOMAX) 0.4 MG CAPS capsule Take 1 capsule (0.4 mg total) by mouth daily. 90 capsule 0   No current facility-administered medications for this visit.   Allergies  Allergen Reactions   Gadolinium Derivatives Hives   Multihance [Gadobenate] Hives    [name]   Iodine Rash     Review of Systems: All systems reviewed and negative except where noted in HPI.    No results found.  Physical Exam: BP 112/72   Pulse 77   Ht _0  (1.803 m)   Wt 157 lb (71.2 kg)   BMI 21.90 kg/m  Constitutional: Pleasant,well-developed, Caucasian male in no acute distress. HEENT: Normocephalic and atraumatic. Conjunctivae are normal. No scleral icterus. Neck supple.  Cardiovascular: Normal rate, regular rhythm.  Pulmonary/chest: Effort normal and breath sounds normal. No wheezing, rales or rhonchi. Abdominal: Soft, nondistended, nontender. Bowel sounds active throughout. There are no masses palpable. No hepatomegaly. Extremities: no edema Lymphadenopathy: No cervical adenopathy noted. Neurological: Alert and oriented to person place and time. Skin: Skin is warm and dry. No rashes noted. Psychiatric: Normal mood and affect. Behavior is normal.  CBC     Component Value Date/Time   WBC 7.1 06/28/2022 0900   RBC 4.39 06/28/2022 0900   HGB 13.4 06/28/2022 0900   HCT 40.6 06/28/2022 0900   PLT 225.0 06/28/2022 0900   MCV 92.4 06/28/2022 0900   MCH 29.9 06/09/2022 1721   MCHC 33.1 06/28/2022 0900   RDW 15.2 06/28/2022 0900   LYMPHSABS 0.8 06/09/2022 1721   MONOABS 0.7 06/09/2022 1721   EOSABS 0.2 06/09/2022 1721   BASOSABS 0.0 06/09/2022 1721    CMP     Component Value Date/Time   NA 136 06/09/2022 1721   K 4.4 06/09/2022 1721   CL 102 06/09/2022 1721   CO2 24 06/09/2022 1721   GLUCOSE 134 (H) 06/09/2022 1721   BUN 17 06/09/2022 1721   CREATININE 1.11 06/09/2022 1721   CALCIUM 9.0 06/09/2022 1721   PROT 7.4 06/09/2022 1721   PROT 6.7 07/01/2021 0750   ALBUMIN 2.9 (L) 06/09/2022 1721   ALBUMIN 4.0 07/01/2021 0750   AST 193 (H) 06/09/2022 1721   ALT 313 (H) 06/09/2022 1721   ALKPHOS 491 (H) 06/09/2022 1721   BILITOT 1.6 (H) 06/09/2022 1721   BILITOT 0.6 07/01/2021 0750   GFRNONAA >60 06/09/2022 1721   GFRAA >60 05/24/2020 0320   Narrative & Impression CLINICAL DATA:  Left upper quadrant abdominal pain abnormal LFTs   EXAM: CT ABDOMEN AND PELVIS WITHOUT CONTRAST   TECHNIQUE: Multidetector CT imaging of the abdomen and pelvis was performed following the standard protocol without IV contrast.   RADIATION DOSE REDUCTION: This exam was performed according to the departmental dose-optimization program which includes automated exposure control, adjustment of the mA and/or kV according to patient size and/or use of iterative reconstruction technique.   COMPARISON:  CT abdomen and pelvis 06/11/2020   FINDINGS: Lower chest: No acute abnormality.   Hepatobiliary: Shrunken left hepatic lobe with nodularity of the left hepatic lobe contour. Possible hypertrophy of the right hepatic lobe. No biliary ductal dilation. Cholecystectomy.   Pancreas: Unremarkable. No pancreatic ductal dilatation or surrounding inflammatory  changes.   Spleen: Spleen at the upper limits of normal in size.   Adrenals/Urinary Tract: Adrenal glands are unremarkable. Kidneys are normal, without renal calculi, suspicious focal lesion, or hydronephrosis. Low-density simple renal cysts bilaterally. Bladder is unremarkable.   Stomach/Bowel: Stomach is within normal limits. The appendix  is normal. No evidence of bowel wall thickening, distention, or inflammatory changes.   Vascular/Lymphatic: Aortic atherosclerosis. Lymph node in the porta hepatis measuring 1.3 cm in short axis similar to 2021.   Reproductive: Enlarged prostate.   Other: No free intraperitoneal fluid or air.   Musculoskeletal: Demineralization.  No acute osseous abnormality.   IMPRESSION: Shrunken nodular contour of the left hepatic lobe compatible with left hepatic lobe cirrhosis. This finding is new since 2021. Further evaluation with CT or MRI with IV contrast is recommended.   These results will be called to the ordering clinician or representative by the Radiologist Assistant, and communication documented in the PACS or [employer].     Electronically Signed   By: Placido Sou M.D.   On: 07/02/2022 23:47   ASSESSMENT AND PLAN: 81 year old male with history of coronary artery disease status post stenting, most recently in March 2022, currently on aspirin with no concerning cardiopulmonary symptoms, referred to [place] for left upper quadrant abdominal pain, cirrhotic morphology of the liver on CT scan and 2 months of persistent watery diarrhea with urgency and incontinence. The etiology of his diarrhea is unclear at this point.  No changes in his diet or medications that accompany the change in bowel habits.  Given the absence of abdominal pain and significant urgency without variations in stool pattern, microscopic colitis is possible.  Mass lesion also possible, the patient has never had a colonoscopy.  Patient also reports unintentional weight  loss.  For these reasons a colonoscopy is indicated. Given his symptoms of left upper quadrant pain, early satiety, decreased appetite and unintentional weight loss, an upper endoscopy is also indicated. We will evaluate stool with fecal lactoferrin, ova and parasite, and fecal elastase.  Check TSH levels and screen for celiac disease. For his elevated liver enzymes and cirrhotic appearing liver, will evaluate for chronic causes of liver disease (autoimmune, viral, genetic/metabolic).  I suspect alcohol is the primary underlying etiology, but we will exclude other concomitant causes. We will obtain ultrasound/elastography to assess liver stiffness and portal vein patency.  Will obtain baseline AFP.   Diarrhea with urgency/incontinence -Fecal lactoferrin -Fecal elastase -Stool O&P - TSH, TTG/IgA - Start Imodium 2 mg before every meal/at bedtime -Colonoscopy with random biopsies  Left upper quadrant abdominal pain, early satiety, weight loss -EGD - Continue omeprazole  Elevated LAEs/Abnormal imaging liver - AFP, INR - Elastography - Exclude other causes of chronic liver disease (viral, autoimmune, genetic/metabolic)  Coronary artery disease - Seen by cardiology in January, asymptomatic, recommended repeat follow-up - Recommended continue aspirin alone for antiplatelet therapy - Patient is active with good functional status and has no concerning symptoms of underlying coronary artery disease or heart failure.  I believe the patient is reasonable candidate for endoscopic evaluation at the [facility]  The details, risks (including bleeding, perforation, infection, missed lesions, medication reactions and possible hospitalization or surgery if complications occur), benefits, and alternatives to EGD/colonoscopy with possible biopsy and possible polypectomy were discussed with the patient and he consents to proceed.   Sher Hellinger E. Candis Schatz, MD [facility]   CC:  Martinique, Betty G, MD

## 2022-08-13 NOTE — Patient Instructions (Signed)
_______________________________________________________  If you are age 83 or older, your body mass index should be between 23-30. Your Body mass index is 21.9 kg/m. If this is out of the aforementioned range listed, please consider follow up with your Primary Care Provider.  If you are age 39 or younger, your body mass index should be between 19-25. Your Body mass index is 21.9 kg/m. If this is out of the aformentioned range listed, please consider follow up with your Primary Care Provider.   Your provider has requested that you go to the basement level for lab work before leaving today. Press "B" on the elevator. The lab is located at the first door on the left as you exit the elevator.  You will be contacted by [place] in the next 2 days to arrange a [facility].  The number on your caller ID will be [phone], please answer when they call.  If you have not heard from them in 2 days please call [phone] to schedule.    You have been scheduled for an endoscopy and colonoscopy. Please follow the written instructions given to you at your visit today. Please pick up your prep supplies at the pharmacy within the next 1-3 days. If you use inhalers (even only as needed), please bring them with you on the day of your procedure.    We have sent the following medications to your pharmacy for you to pick up at your convenience: Imodium 2 mg. Take one after meals and one at bedtime. _  The [facility] providers would like to encourage you to use [facility] to communicate with providers for non-urgent requests or questions.  Due to long hold times on the telephone, sending your provider a message by [facility] may be a faster and more efficient way to get a response.  Please allow 48 business hours for a response.  Please remember that this is for non-urgent requests.   Due to recent changes in healthcare laws, you may see the results of your imaging and laboratory studies on  MyChart before your provider has had a chance to review them.  We understand that in some cases there may be results that are confusing or concerning to you. Not all laboratory results come back in the same time frame and the provider may be waiting for multiple results in order to interpret others.  Please give [place] 48 hours in order for your provider to thoroughly review all the results before contacting the office for clarification of your results.   It was a pleasure to see you today!  Thank you for trusting me with your gastrointestinal care!

## 2022-08-16 ENCOUNTER — Other Ambulatory Visit: Payer: Medicare HMO

## 2022-08-16 ENCOUNTER — Telehealth: Payer: Self-pay

## 2022-08-16 ENCOUNTER — Other Ambulatory Visit: Payer: Self-pay

## 2022-08-16 ENCOUNTER — Encounter: Payer: Self-pay | Admitting: Gastroenterology

## 2022-08-16 ENCOUNTER — Ambulatory Visit (AMBULATORY_SURGERY_CENTER): Payer: Medicare HMO | Admitting: Gastroenterology

## 2022-08-16 VITALS — BP 133/74 | HR 75 | Temp 97.5°F | Resp 15 | Ht 71.0 in | Wt 157.0 lb

## 2022-08-16 DIAGNOSIS — I8500 Esophageal varices without bleeding: Secondary | ICD-10-CM

## 2022-08-16 DIAGNOSIS — K6289 Other specified diseases of anus and rectum: Secondary | ICD-10-CM

## 2022-08-16 DIAGNOSIS — K2970 Gastritis, unspecified, without bleeding: Secondary | ICD-10-CM

## 2022-08-16 DIAGNOSIS — C20 Malignant neoplasm of rectum: Secondary | ICD-10-CM | POA: Diagnosis not present

## 2022-08-16 DIAGNOSIS — R1012 Left upper quadrant pain: Secondary | ICD-10-CM

## 2022-08-16 DIAGNOSIS — R197 Diarrhea, unspecified: Secondary | ICD-10-CM

## 2022-08-16 DIAGNOSIS — R748 Abnormal levels of other serum enzymes: Secondary | ICD-10-CM

## 2022-08-16 DIAGNOSIS — R634 Abnormal weight loss: Secondary | ICD-10-CM

## 2022-08-16 MED ORDER — DIPHENHYDRAMINE HCL 50 MG PO TABS: ORAL_TABLET | ORAL | 0 refills | Status: DC

## 2022-08-16 MED ORDER — PREDNISONE 50 MG PO TABS: ORAL_TABLET | ORAL | 0 refills | Status: DC

## 2022-08-16 MED ORDER — SODIUM CHLORIDE 0.9 % IV SOLN: 500.0000 mL | Freq: Once | INTRAVENOUS | Status: DC

## 2022-08-16 NOTE — Progress Notes (Signed)
History and Physical Interval Note:  08/16/2022 2:06 PM  [name]  has presented today for endoscopic procedure(s), with the diagnosis of  Encounter Diagnoses  Name Primary?   Left upper quadrant abdominal pain Yes   Diarrhea, unspecified type   .  The various methods of evaluation and treatment have been discussed with the patient and/or family. After consideration of risks, benefits and other options for treatment, the patient has consented to  the endoscopic procedure(s).   The patient's history has been reviewed, patient examined, no change in status, stable for endoscopic procedure(s).  I have reviewed the patient's chart and labs.  Questions were answered to the patient's satisfaction.     Kenechukwu Eckstein E. Candis Schatz, MD [facility]

## 2022-08-16 NOTE — Progress Notes (Signed)
Addendum to pt instructions:  Per Dr Candis Schatz,  Pt to take miralax one capful daily to keep stools soft and prevent impaction around rectal mass, pt and wife and daughter aware and verb understanding.

## 2022-08-16 NOTE — Op Note (Signed)
[facility] Patient Name: [name] Procedure Date: 08/16/2022 2:07 PM MRN: [id number] Endoscopist: Nicki Reaper E. Candis Schatz , MD Age: 81 Referring MD:  Date of Birth: 1941/01/30 Gender: Male Account #: [id number] Procedure:                Upper GI endoscopy Indications:              Abdominal pain in the left upper quadrant, Early                            satiety, Weight loss Medicines:                Monitored Anesthesia Care Procedure:                Pre-Anesthesia Assessment:                           - Prior to the procedure, a History and Physical                            was performed, and patient medications and                            allergies were reviewed. The patient's tolerance of                            previous anesthesia was also reviewed. The risks                            and benefits of the procedure and the sedation                            options and risks were discussed with the patient.                            All questions were answered, and informed consent                            was obtained. Prior Anticoagulants: The patient has                            taken no previous anticoagulant or antiplatelet                            agents except for aspirin. ASA Grade Assessment:                            III - A patient with severe systemic disease. After                            reviewing the risks and benefits, the patient was                            deemed in satisfactory condition to undergo the  procedure.                           After obtaining informed consent, the endoscope was                            passed under direct vision. Throughout the                            procedure, the patient's blood pressure, pulse, and                            oxygen saturations were monitored continuously. The                            GIF [id number] #[id number] was introduced through the                             mouth, and advanced to the second part of duodenum.                            The upper GI endoscopy was accomplished without                            difficulty. The patient tolerated the procedure                            well. Scope In: Scope Out: Findings:                 The examined portions of the nasopharynx,                            oropharynx and larynx were normal.                           Grade I varices were found in the lower third of                            the esophagus. They were small in size.                           The exam of the esophagus was otherwise normal.                           Diffuse severe inflammation characterized by                            erythema and friability was found in the entire                            examined stomach. Biopsies were taken with a cold                            forceps for Helicobacter pylori testing. Estimated  blood loss was minimal.                           The exam of the stomach was otherwise normal.                           The examined duodenum was normal. Biopsies for                            histology were taken with a cold forceps for                            evaluation of celiac disease. Estimated blood loss                            was minimal. Complications:            No immediate complications. Estimated Blood Loss:     Estimated blood loss was minimal. Impression:               - The examined portions of the nasopharynx,                            oropharynx and larynx were normal.                           - Grade I esophageal varices.                           - Gastritis. Biopsied.                           - Normal examined duodenum. Biopsied. Recommendation:           - Patient has a contact number available for                            emergencies. The signs and symptoms of potential                            delayed complications were  discussed with the                            patient. Return to normal activities tomorrow.                            Written discharge instructions were provided to the                            patient.                           - Resume previous diet.                           - Continue present medications.                           -  Await pathology results.                           - Proceed with colonoscopy. Breyanna Valera E. Candis Schatz, MD 08/16/2022 3:12:21 PM This report has been signed electronically.

## 2022-08-16 NOTE — Progress Notes (Signed)
Called to room to assist during endoscopic procedure.  Patient ID and intended procedure confirmed with present staff. Received instructions for my participation in the procedure from the performing physician.  

## 2022-08-16 NOTE — Patient Instructions (Addendum)
Handout given for gastritis.  Keep CT scan instructions handy to fill out when they call and schedule.   Keep contrast in the refrigerator so it will be cold when you need to drink it on the day of the CT scan.  Dr Lurline Del nurse will call you to schedule the CT scan.  CEA level done today at the Lab here in the basement.   YOU HAD AN ENDOSCOPIC PROCEDURE TODAY AT [facility]:   Refer to the procedure report that was given to you for any specific questions about what was found during the examination.  If the procedure report does not answer your questions, please call your gastroenterologist to clarify.  If you requested that your care partner not be given the details of your procedure findings, then the procedure report has been included in a sealed envelope for you to review at your convenience later.  YOU SHOULD EXPECT: Some feelings of bloating in the abdomen. Passage of more gas than usual.  Walking can help get rid of the air that was put into your GI tract during the procedure and reduce the bloating. If you had a lower endoscopy (such as a colonoscopy or flexible sigmoidoscopy) you may notice spotting of blood in your stool or on the toilet paper. If you underwent a bowel prep for your procedure, you may not have a normal bowel movement for a few days.  Please Note:  You might notice some irritation and congestion in your nose or some drainage.  This is from the oxygen used during your procedure.  There is no need for concern and it should clear up in a day or so.  SYMPTOMS TO REPORT IMMEDIATELY:  Following lower endoscopy (colonoscopy):  Excessive amounts of blood in the stool  Significant tenderness or worsening of abdominal pains  Swelling of the abdomen that is new, acute  Fever of 100F or higher  Following upper endoscopy (EGD)  Vomiting of blood or coffee ground material  New chest pain or pain under the shoulder blades  Painful or persistently difficult  swallowing  New shortness of breath  Black, tarry-looking stools  For urgent or emergent issues, a gastroenterologist can be reached at any hour by calling [phone]. Do not use MyChart messaging for urgent concerns.    DIET:  We do recommend a small meal at first, but then you may proceed to your regular diet.  Drink plenty of fluids but you should avoid alcoholic beverages for 24 hours.  ACTIVITY:  You should plan to take it easy for the rest of today and you should NOT DRIVE or use heavy machinery until tomorrow (because of the sedation medicines used during the test).    FOLLOW UP: Our staff will call the number listed on your records the next business day following your procedure.  We will call around 7:15- 8:00 am to check on you and address any questions or concerns that you may have regarding the information given to you following your procedure. If we do not reach you, we will leave a message.     If any biopsies were taken you will be contacted by phone or by letter within the next 3-5 days.  Please call us at [phone] if you have not heard about the biopsies in 1 week.    SIGNATURES/CONFIDENTIALITY: You and/or your care partner have signed paperwork which will be entered into your electronic medical record.  These signatures attest to the fact that that the  information above on your After Visit Summary has been reviewed and is understood.  Full responsibility of the confidentiality of this discharge information lies with you and/or your care-partner.

## 2022-08-16 NOTE — Op Note (Signed)
[place] Patient Name: [name] Procedure Date: 08/16/2022 2:06 PM MRN: [id number] Endoscopist: Nicki Reaper E. Candis Schatz , MD Age: 81 Referring MD:  Date of Birth: 1941/11/10 Gender: Male Account #: [id number] Procedure:                Colonoscopy Indications:              Clinically significant diarrhea of unexplained                            origin, Weight loss Medicines:                Monitored Anesthesia Care Procedure:                Pre-Anesthesia Assessment:                           - Prior to the procedure, a History and Physical                            was performed, and patient medications and                            allergies were reviewed. The patient's tolerance of                            previous anesthesia was also reviewed. The risks                            and benefits of the procedure and the sedation                            options and risks were discussed with the patient.                            All questions were answered, and informed consent                            was obtained. Prior Anticoagulants: The patient has                            taken no previous anticoagulant or antiplatelet                            agents except for aspirin. ASA Grade Assessment:                            III - A patient with severe systemic disease. After                            reviewing the risks and benefits, the patient was                            deemed in satisfactory condition to undergo the  procedure.                           After obtaining informed consent, the colonoscope                            was passed under direct vision. Throughout the                            procedure, the patient's blood pressure, pulse, and                            oxygen saturations were monitored continuously. The                            CF [id number] #[id number] was introduced through the anus                             and advanced to the the cecum, identified by                            appendiceal orifice and ileocecal valve. The                            colonoscopy was performed without difficulty. The                            patient tolerated the procedure well. The quality                            of the bowel preparation was inadequate. The                            ileocecal valve, appendiceal orifice, and rectum                            were photographed. Scope In: 2:36:37 PM Scope Out: 3:05:49 PM Scope Withdrawal Time: 0 hours 15 minutes 35 seconds  Total Procedure Duration: 0 hours 29 minutes 12 seconds  Findings:                 Hemorrhoids were found on perianal exam.                           The digital rectal exam was normal. Pertinent                            negatives include normal sphincter tone and no                            palpable rectal lesions.                           An ulcerated partially obstructing large mass was  found in the proximal rectum (about 10 cm from the                            anal verge) The mass was partially (about 75%)                            circumferential. The mass measured approximately 5                            cm in length. No bleeding was present. This was                            biopsied with a cold forceps for histology. Area                            was tattooed with an injection of 3 mL of Spot                            (carbon black) 2-3 cm proximal to the mass.                           The exam was otherwise normal throughout the                            examined colon. Complications:            No immediate complications. Estimated Blood Loss:     Estimated blood loss was minimal. Impression:               - Preparation of the colon was inadequate.                           - Hemorrhoids found on perianal exam.                           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 Biopsied. Tattooed. Recommendation:           - Patient has a contact number available for                            emergencies. The signs and symptoms of potential                            delayed complications were discussed with the                            patient. Return to normal activities tomorrow.                            Written discharge instructions were provided to the  patient.                           - Resume previous diet.                           - Continue present medications.                           - Await pathology results.                           - Perform a CT scan (computed tomography) of chest                            with contrast, abdomen with contrast and pelvis                            with contrast at appointment to be scheduled. 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 Patient                            had a cardiac catheterization last year with 180 mL                            iohexol without documentation of premedication, and                            no allergic reaction was reported by the patient                           - Obtain baseline CEA level.                           - Will place referrals to oncology and colorectal                            surgery to discuss treatment once staging is                            complete. Laylia Mui E. Candis Schatz, MD 08/16/2022 3:23:02 PM This report has been signed electronically.

## 2022-08-16 NOTE — Progress Notes (Signed)
To pacu, VSS. Report to [email or web address] 

## 2022-08-16 NOTE — Telephone Encounter (Signed)
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  Thanks

## 2022-08-16 NOTE — Telephone Encounter (Signed)
Your records indicate that you have an allergy/sensitivity to one or more components within IV contrast dye. We have sent a prescription of Prednisone 50 mg (3 tablets) and Benadryl 50 mg (1 tablet) to your pharmacy as a pre-mediation for your contrasted procedure which should prevent any reaction from occurring.  Take (1) 50 mg tablet of prednisone 13 hours prior to your procedure.   Take (1) 50 mg tablet of prednisone 7 hours prior to your procedure.   Take (1) 50 mg tablet of prednisone and (1) 50 mg tablet of Benadryl 1 hour prior to your procedure.  Rad scheduling to contact pt to schedule CT scan.

## 2022-08-16 NOTE — Telephone Encounter (Signed)
Orders in epic for CT scan. Prescriptions for premeds for contrast allergy sent to pharmacy. Pt will be called from rad scheduling to set up CT scan.

## 2022-08-17 ENCOUNTER — Other Ambulatory Visit: Payer: Self-pay

## 2022-08-17 ENCOUNTER — Telehealth: Payer: Self-pay | Admitting: *Deleted

## 2022-08-17 DIAGNOSIS — C20 Malignant neoplasm of rectum: Secondary | ICD-10-CM

## 2022-08-17 LAB — AFP TUMOR MARKER: AFP-Tumor Marker: 111.8 ng/mL — ABNORMAL HIGH (ref <6.1)

## 2022-08-17 LAB — HEPATITIS B SURFACE ANTIBODY,QUALITATIVE: Hep B S Ab: REACTIVE — AB

## 2022-08-17 LAB — ALPHA-1-ANTITRYPSIN: A-1 Antitrypsin, Ser: 213 mg/dL — ABNORMAL HIGH (ref 83–199)

## 2022-08-17 LAB — IGG: IgG (Immunoglobin G), Serum: 1299 mg/dL (ref 600–1540)

## 2022-08-17 LAB — CEA: CEA: 5 ng/mL — ABNORMAL HIGH

## 2022-08-17 LAB — CERULOPLASMIN: Ceruloplasmin: 38 mg/dL — ABNORMAL HIGH (ref 18–36)

## 2022-08-17 LAB — HEPATITIS A ANTIBODY, TOTAL: Hepatitis A AB,Total: REACTIVE — AB

## 2022-08-17 LAB — ANTI-SMOOTH MUSCLE ANTIBODY, IGG: Actin (Smooth Muscle) Antibody (IGG): 29 U — ABNORMAL HIGH (ref <20)

## 2022-08-17 LAB — HEPATITIS C ANTIBODY: Hepatitis C Ab: NONREACTIVE

## 2022-08-17 LAB — MITOCHONDRIAL ANTIBODIES: Mitochondrial M2 Ab, IgG: <=20 U (ref <=20.0)

## 2022-08-17 LAB — HEPATITIS B SURFACE ANTIGEN: Hepatitis B Surface Ag: NONREACTIVE

## 2022-08-17 NOTE — Telephone Encounter (Signed)
  Follow up Call-     08/16/2022    2:01 PM  Call back number  Post procedure Call Back phone  # [phone] - daughter Tye Maryland  Permission to leave phone message Yes     Patient questions:  Do you have a fever, pain , or abdominal swelling? No. Pain Score  0 *  Have you tolerated food without any problems? Yes.    Have you been able to return to your normal activities? Yes.    Do you have any questions about your discharge instructions: Diet   No. Medications  No. Follow up visit  No.  Do you have questions or concerns about your Care? No.  Actions: * If pain score is 4 or above: No action needed, pain <4.

## 2022-08-18 ENCOUNTER — Telehealth: Payer: Self-pay | Admitting: Hematology

## 2022-08-18 NOTE — Progress Notes (Signed)
I spoke with the patient's daughter via telephone today and relayed the pathology results which confirmed our suspicion of rectal cancer.  She understands the next step is the staging CT.  She is awaiting calls to schedule the CT and oncology/surgery appointments.

## 2022-08-18 NOTE — Telephone Encounter (Signed)
Scheduled appt per 9/19 referral. Pt's daughter is aware of appt date and time. Pt's daughter is aware to arrive 15 mins prior to appt time and to bring and updated insurance card. Pt's daughter is aware of appt location.

## 2022-08-23 ENCOUNTER — Other Ambulatory Visit: Payer: Self-pay

## 2022-08-23 ENCOUNTER — Inpatient Hospital Stay: Payer: Medicare HMO | Admitting: Hematology

## 2022-08-23 ENCOUNTER — Encounter: Payer: Self-pay | Admitting: Hematology

## 2022-08-23 ENCOUNTER — Inpatient Hospital Stay: Payer: Medicare HMO | Attending: Hematology

## 2022-08-23 ENCOUNTER — Telehealth: Payer: Self-pay | Admitting: Gastroenterology

## 2022-08-23 VITALS — BP 130/75 | HR 64 | Temp 98.1°F | Resp 16

## 2022-08-23 DIAGNOSIS — M79602 Pain in left arm: Secondary | ICD-10-CM

## 2022-08-23 DIAGNOSIS — Z7289 Other problems related to lifestyle: Secondary | ICD-10-CM | POA: Insufficient documentation

## 2022-08-23 DIAGNOSIS — Z79899 Other long term (current) drug therapy: Secondary | ICD-10-CM | POA: Diagnosis not present

## 2022-08-23 DIAGNOSIS — I2510 Atherosclerotic heart disease of native coronary artery without angina pectoris: Secondary | ICD-10-CM

## 2022-08-23 DIAGNOSIS — C20 Malignant neoplasm of rectum: Secondary | ICD-10-CM

## 2022-08-23 DIAGNOSIS — K7460 Unspecified cirrhosis of liver: Secondary | ICD-10-CM | POA: Diagnosis not present

## 2022-08-23 DIAGNOSIS — I10 Essential (primary) hypertension: Secondary | ICD-10-CM | POA: Insufficient documentation

## 2022-08-23 LAB — CBC WITH DIFFERENTIAL/PLATELET
Abs Immature Granulocytes: 0.03 10*3/uL (ref 0.00–0.07)
Basophils Absolute: 0 10*3/uL (ref 0.0–0.1)
Basophils Relative: 0 %
Eosinophils Absolute: 0.2 10*3/uL (ref 0.0–0.5)
Eosinophils Relative: 4 %
HCT: 38.7 % — ABNORMAL LOW (ref 39.0–52.0)
Hemoglobin: 13 g/dL (ref 13.0–17.0)
Immature Granulocytes: 1 %
Lymphocytes Relative: 20 %
Lymphs Abs: 1.1 10*3/uL (ref 0.7–4.0)
MCH: 31.9 pg (ref 26.0–34.0)
MCHC: 33.6 g/dL (ref 30.0–36.0)
MCV: 94.9 fL (ref 80.0–100.0)
Monocytes Absolute: 0.5 10*3/uL (ref 0.1–1.0)
Monocytes Relative: 10 %
Neutro Abs: 3.5 10*3/uL (ref 1.7–7.7)
Neutrophils Relative %: 65 %
Platelets: 173 10*3/uL (ref 150–400)
RBC: 4.08 MIL/uL — ABNORMAL LOW (ref 4.22–5.81)
RDW: 14.7 % (ref 11.5–15.5)
WBC: 5.4 10*3/uL (ref 4.0–10.5)
nRBC: 0 % (ref 0.0–0.2)

## 2022-08-23 LAB — COMPREHENSIVE METABOLIC PANEL
ALT: 53 U/L — ABNORMAL HIGH (ref 0–44)
AST: 68 U/L — ABNORMAL HIGH (ref 15–41)
Albumin: 3.2 g/dL — ABNORMAL LOW (ref 3.5–5.0)
Alkaline Phosphatase: 461 U/L — ABNORMAL HIGH (ref 38–126)
Anion gap: 5 (ref 5–15)
BUN: 19 mg/dL (ref 8–23)
CO2: 25 mmol/L (ref 22–32)
Calcium: 8.7 mg/dL — ABNORMAL LOW (ref 8.9–10.3)
Chloride: 108 mmol/L (ref 98–111)
Creatinine, Ser: 0.91 mg/dL (ref 0.61–1.24)
GFR, Estimated: >60 mL/min (ref >60)
Glucose, Bld: 117 mg/dL — ABNORMAL HIGH (ref 70–99)
Potassium: 4.3 mmol/L (ref 3.5–5.1)
Sodium: 138 mmol/L (ref 135–145)
Total Bilirubin: 1.3 mg/dL — ABNORMAL HIGH (ref 0.3–1.2)
Total Protein: 6.8 g/dL (ref 6.5–8.1)

## 2022-08-23 NOTE — Progress Notes (Signed)
I met with [name], his wife and daughter after  his consultation with Dr Burr Medico.  I explained my role as a nurse navigator and provided my contact information. I explained the services provided at [facility] and provided written information.  I explained the alight grant and let  him know one of the financial advisors will reach out to  him at the time of his chemo education class. I told him that he will be scheduled for chemotherapy education class prior to receiving chemotherapy.  I told him our schedulers will call him with those appts.  All questions were answered. They verbalized understanding.

## 2022-08-23 NOTE — Telephone Encounter (Signed)
Patient's daughter called states Dr Candis Schatz called her and states she should reach out to [place] in order to schedule a pulvis and abdomen CT. Requesting a call back as soon as possible [phone]. Please call to advise

## 2022-08-23 NOTE — Telephone Encounter (Signed)
Returned call to patient's daughter, Tye Maryland. I provided Tye Maryland with the phone number to radiology scheduling. Cathy verbalized understanding and had no concerns at the end of the call.

## 2022-08-23 NOTE — Progress Notes (Signed)
[place]   [phone] Fax:([phone]) [place] Note   Patient Care Team: Martinique, Betty G, MD as PCP - General (Family Medicine) Burnell Blanks, MD as PCP - Cardiology (Cardiology) Truitt Merle, MD as Consulting Physician (Oncology) Royston Bake, RN as Oncology Nurse Navigator (Oncology)  Date of Service:  08/23/2022   CHIEF COMPLAINTS/PURPOSE OF CONSULTATION:  Newly Diagnosed Rectal Cancer  REFERRING PHYSICIAN:  Dr. Candis Schatz  ASSESSMENT & PLAN: [name] is a 81 y.o.  male with a history of heavy alcohol drinking  1. 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 Colonoscopy on 08/16/22 by Dr. Candis Schatz showed partially obstructing tumor in proximal rectum; path confirmed invasive moderately differentiated adenocarcinoma.  -CT AP without contrast 07/02/22 showed only new left hepatic cirrhosis, no rectal mass, adenopathy or evidence of distant metastasis.  However the scan was done without contrast, which limits the evaluation. -I reviewed the work up with the patient and his daughter today.  I recommend pelvic MRI, and a CT chest to complete his staging. -Given the longstanding symptoms, I suspect that this is low-grade advanced disease at least.  I discussed the role of neoadjuvant chemotherapy and radiation, and possible total neoadjuvant, before surgery.  Given his advanced age, poor performance status, and newly diagnosed liver cirrhosis, IV chemotherapy may be challenge, although not impossible. -We will refer him to colorectal surgeons.  We will present his case in our GI conference to finalize his treatment plan.  2.  Newly diagnosed liver cirrhosis, heavy alcohol drinking history -Due to the intermittent left upper abdominal pain, he had a CT on July 02, 2022, which showed liver cirrhosis. -Follow-up with GI  Dr. Candis Schatz  3. HTN and CAD -Follow-up with primary care physician and cardiology.   4. Left arm pain -not sure about the etiology, no tenderness.  We will see if his CT chest reveal any potential cause.  PLAN:  -CT chest without contrast,, MRI with and without contrast, pelvic MRI for rectal cancer staging  as soon as possible  -lab today  -referral to [facility] to Dr. Johney Maine or Dema Severin  -tumor board discussion  -will see him back after the scans to finalize his treatment plan. Will refer to RAD/ONC in needed    Oncology History  Rectal cancer ([place])  07/02/2022 Imaging   CLINICAL DATA:  Left upper quadrant abdominal pain abnormal LFTs   EXAM: CT ABDOMEN AND PELVIS WITHOUT CONTRAST  IMPRESSION: Shrunken nodular contour of the left hepatic lobe compatible with left hepatic lobe cirrhosis. This finding is new since 2021. Further evaluation with CT or MRI with IV contrast is recommended.   08/13/2022 Tumor Marker   Patient's tumor was tested for the following markers: AFP. Results of the tumor marker test revealed elevation (111.8).   08/16/2022 Procedure   Colonoscopy, Dr. Candis Schatz  Impression: - Preparation of the colon was inadequate. - Hemorrhoids found on perianal exam. - Malignant partially obstructing tumor in the proximal rectum. Biopsied. Tattooed.   08/16/2022 Initial Biopsy   3. Rectum, biopsy, mass - INVASIVE MODERATELY DIFFERENTIATED ADENOCARCINOMA   08/23/2022 Initial Diagnosis   Rectal cancer (HCC)      HISTORY OF PRESENTING ILLNESS:  [name] 81 y.o. male is a here because of rectal cancer. The patient was referred by Dr. Candis Schatz. The patient presents to the clinic today accompanied by his wife and  his daughter.  Patient has poor vision and hearing loss, is a poor historian.  He reports frequent bowel movement with loose stool, and intermittent rectal bleeding for a long time, " over 6 months, probably a year" per his wife.  He saw his primary  care physician several times in the last year but was not happy with them since no further work up was done. He changed his primary care physician to Dr. Martinique and had CT abdomen and pelvis done on July 02, 2022 which showed liver cirrhosis.  Due to the worsening diarrhea and hematochezia, he was referred to GI Dr. Candis Schatz, and underwent colonoscopy on August 16, 2022.  It showed a partially obstructing mass in the upper rectum, 10 cm from anal verge, biopsy confirmed moderately differentiated adenocarcinoma.  He was referred to [place] for further management.  He has had stool incontinence, and very frequent bowel movement every few hours, he has not been able to go out due to that.  He has been taking MiraLAX with each meal, without much improvement.  He also reports worsening hematochezia lately.  Abdominal pain is mainly in the left upper quadrant, he described as shooting pain, more at night.  No significant nausea, vomiting.  His appetite overall is fair, he has lost about 40 pounds in the past year.  He also reports bilateral shoulder pain started a month ago, which has moved to his left upper arm, he was holding his left upper arm in the wheelchair, when I saw him.  He has past medical history is significant for coronary artery disease, status post multiple stent placement.  He is on cardiac meds, which he has stopped per his primary care physician due to the newly diagnosed liver cirrhosis.  He also complains of chronic insomnia, which he has been taking Ativan 0.5 mg at night but does not feel helpful.  He lives with his wife, daughter lives nearby.  Due to his significant diarrhea and left arm pain, he has not been able to do much at home.  He is to drink alcohol heavily for many years, but has quit several years ago.   REVIEW OF SYSTEMS:    Constitutional: Denies fevers, chills or abnormal night sweats, (+) fatigue and weight loss  Eyes: Denies blurriness of vision, double vision or watery  eyes Ears, nose, mouth, throat, and face: Denies mucositis or sore throat Respiratory: Denies cough, dyspnea or wheezes Cardiovascular: Denies palpitation, chest discomfort or lower extremity swelling Gastrointestinal:  see HPI  Skin: Denies abnormal skin rashes Lymphatics: Denies new lymphadenopathy or easy bruising Neurological:Denies numbness, tingling or new weaknesses, (+) left upper arm pain  Behavioral/Psych: Mood is stable, no new changes  All other systems were reviewed with the patient and are negative.   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, 07/25/2008 treated with 2 overlapping DES   Depression    Elevated cholesterol    Gallstones    Glaucoma    Hyperlipidemia    with low high-density lipoprotein   Irritable bowel syndrome    Kidney stone    Kidney stones     SURGICAL HISTORY: Past Surgical History:  Procedure Laterality Date   BACK SURGERY     CHOLECYSTECTOMY     CORONARY STENT INTERVENTION N/A  02/20/2021   Procedure: CORONARY STENT INTERVENTION;  Surgeon: Leonie Man, MD;  Location: [facility];  Service: Cardiovascular;  Laterality: N/A;   CORONARY STENT PLACEMENT     2 overlapping Promus DES (covering existing BMS) in RCA: Promus DES 3.5x23 (x 2) -> post-dilated to 3.75  mm   LEFT HEART CATH AND CORONARY ANGIOGRAPHY N/A 02/20/2021   Procedure: LEFT HEART CATH AND CORONARY ANGIOGRAPHY;  Surgeon: Leonie Man, MD;  Location: [facility];  Service: Cardiovascular;  Laterality: N/A;    SOCIAL HISTORY: Social History   Socioeconomic History   Marital status: Married    Spouse name: Not on file   Number of children: 3   Years of education: Not on file   Highest education level: Not on file  Occupational History   Not on  file  Tobacco Use   Smoking status: Never   Smokeless tobacco: Never  Vaping Use   Vaping Use: Never used  Substance and Sexual Activity   Alcohol use: Not Currently    Comment: he used to drink heavily for many years   Drug use: No   Sexual activity: Not on file  Other Topics Concern   Not on file  Social History Narrative   Not on file   Social Determinants of Health   Financial Resource Strain: Not on file  Food Insecurity: Not on file  Transportation Needs: Not on file  Physical Activity: Not on file  Stress: Not on file  Social Connections: Not on file  Intimate Partner Violence: Not on file    FAMILY HISTORY: Family History  Problem Relation Age of Onset   Colon cancer Neg Hx    Esophageal cancer Neg Hx    Stomach cancer Neg Hx    Rectal cancer Neg Hx     ALLERGIES:  is allergic to gadolinium derivatives, multihance [gadobenate], and iodine.  MEDICATIONS:  Current Outpatient Medications  Medication Sig Dispense Refill   aspirin 81 MG tablet Take 81 mg by mouth daily.     atorvastatin (LIPITOR) 40 MG tablet Take 40 mg by mouth daily. (Patient not taking: Reported on 08/13/2022)     diphenhydrAMINE (BENADRYL) 50 MG tablet Take 1 hour before ct scan 30 tablet 0   dorzolamide (TRUSOPT) 2 % ophthalmic solution 1 drop 3 (three) times daily.     dorzolamide-timolol (COSOPT) 22.3-6.8 MG/ML ophthalmic solution Place 1 drop into both eyes daily.     gabapentin (NEURONTIN) 100 MG capsule Take 2 capsules (200 mg total) by mouth at bedtime. 60 capsule 1   lisinopril (ZESTRIL) 5 MG tablet Take 5 mg by mouth daily. (Patient not taking: Reported on 08/13/2022)     loperamide (IMODIUM A-D) 2 MG tablet Take 1 tablet (2 mg total) by mouth 3 (three) times daily as needed for diarrhea or loose stools. 60 tablet 3   LORazepam (ATIVAN) 0.5 MG tablet Take 1 tablet by mouth as needed for sleep. (Patient not taking: Reported on 08/13/2022)     LUMIGAN 0.01 % SOLN Place 1 drop into both  eyes daily.     nitroGLYCERIN (NITROSTAT) 0.4 MG SL tablet Place 1 tablet (0.4 mg total) under the tongue every 5 (five) minutes x 3 doses as needed for chest pain. (Patient not taking: Reported on 08/13/2022) 25 tablet 2   omeprazole (PRILOSEC) 40 MG capsule Take 1 capsule (40 mg total) by mouth daily. 30 capsule 1   predniSONE (DELTASONE) 50 MG tablet Take 1 tab 13  hours before the CT scan Take 1 tab 7 hours before the CT scan Take 1 tab 1 hour before the CT scan 3 tablet 0   sertraline (ZOLOFT) 50 MG tablet Take 50 mg by mouth daily.     tamsulosin (FLOMAX) 0.4 MG CAPS capsule Take 1 capsule (0.4 mg total) by mouth daily. (Patient not taking: Reported on 08/13/2022) 90 capsule 0   No current facility-administered medications for this visit.    PHYSICAL EXAMINATION: ECOG PERFORMANCE STATUS: 3 - Symptomatic, >50% confined to bed  Vitals:   08/23/22 1457  BP: 130/75  Pulse: 64  Resp: 16  Temp: 98.1 F (36.7 C)  SpO2: 100%   Filed Weights    GENERAL:alert, no distress and comfortable SKIN: skin color, texture, turgor are normal, no rashes or significant lesions EYES: normal, Conjunctiva are pink and non-injected, sclera clear NECK: supple, thyroid normal size, non-tender, without nodularity LYMPH:  no palpable lymphadenopathy in the cervical, axillary  LUNGS: clear to auscultation and percussion with normal breathing effort HEART: regular rate & rhythm and no murmurs and no lower extremity edema ABDOMEN:abdomen soft, non-tender and normal bowel sounds Musculoskeletal:no cyanosis of digits and no clubbing, no tenderness at left upper arm  NEURO: alert & oriented x 3 with fluent speech, no focal motor/sensory deficits RECTAL:deferred   LABORATORY DATA:  I have reviewed the data as listed    Latest Ref Rng & Units 08/23/2022    4:14 PM 06/28/2022    9:00 AM 06/09/2022    5:21 PM  CBC  WBC 4.0 - 10.5 K/uL 5.4  7.1  7.8   Hemoglobin 13.0 - 17.0 g/dL 13.0  13.4  12.9   Hematocrit  39.0 - 52.0 % 38.7  40.6  40.6   Platelets 150 - 400 K/uL 173  225.0  270        Latest Ref Rng & Units 08/13/2022    9:56 AM 06/09/2022    5:21 PM 07/01/2021    7:50 AM  CMP  Glucose 70 - 99 mg/dL  134    BUN 8 - 23 mg/dL  17    Creatinine 0.61 - 1.24 mg/dL  1.11    Sodium 135 - 145 mmol/L  136    Potassium 3.5 - 5.1 mmol/L  4.4    Chloride 98 - 111 mmol/L  102    CO2 22 - 32 mmol/L  24    Calcium 8.9 - 10.3 mg/dL  9.0    Total Protein 6.0 - 8.3 g/dL 7.3  7.4  6.7   Total Bilirubin 0.2 - 1.2 mg/dL 1.8  1.6  0.6   Alkaline Phos 39 - 117 U/L 490  491  148   AST 0 - 37 U/L 62  193  40   ALT 0 - 53 U/L 55  313  45      RADIOGRAPHIC STUDIES: I have personally reviewed the radiological images as listed and agreed with the findings in the report. No results found.   Orders Placed This Encounter  Procedures   MR Abdomen W Wo Contrast    Standing Status:   Future    Standing Expiration Date:   08/24/2023    Order Specific Question:   If indicated for the ordered procedure, I authorize the administration of contrast media per Radiology protocol    Answer:   Yes    Order Specific Question:   What is the patient's sedation requirement?    Answer:   No Sedation  Order Specific Question:   Does the patient have a pacemaker or implanted devices?    Answer:   No    Order Specific Question:   Preferred imaging location?    Answer:   [facility] (table limit - 550 lbs)   MR PELVIS WO CM RECTAL CA STAGING    Standing Status:   Future    Standing Expiration Date:   08/24/2023    Order Specific Question:   If indicated for the ordered procedure, I authorize the administration of contrast media per Radiology protocol    Answer:   Yes    Order Specific Question:   What is the patient's sedation requirement?    Answer:   No Sedation    Order Specific Question:   Does the patient have a pacemaker or implanted devices?    Answer:   No    Order Specific Question:   Preferred imaging  location?    Answer:   [facility] (table limit - 550 lbs)   CT Chest Wo Contrast    Standing Status:   Future    Standing Expiration Date:   08/23/2023    Order Specific Question:   Preferred imaging location?    Answer:   [facility]   CBC with Differential/Platelet    Standing Status:   Standing    Number of Occurrences:   50    Standing Expiration Date:   08/24/2023   Comprehensive metabolic panel    Standing Status:   Standing    Number of Occurrences:   50    Standing Expiration Date:   08/24/2023   [facility] (IN HOUSE-[facility])    Standing Status:   Standing    Number of Occurrences:   5    Standing Expiration Date:   08/24/2023   Ambulatory referral to General Surgery    Referral Priority:   Routine    Referral Type:   Surgical    Referral Reason:   Specialty Services Required    Requested Specialty:   General Surgery    Number of Visits Requested:   1    All questions were answered. The patient knows to call the clinic with any problems, questions or concerns. The total time spent in the appointment was 60 minutes.     Truitt Merle, MD 08/23/2022 4:42 PM  I, Wilburn Mylar, am acting as scribe for Truitt Merle, MD.   I have reviewed the above documentation for accuracy and completeness, and I agree with the above.

## 2022-08-24 ENCOUNTER — Inpatient Hospital Stay: Payer: Medicare HMO | Admitting: Licensed Clinical Social Worker

## 2022-08-24 ENCOUNTER — Other Ambulatory Visit: Payer: Self-pay

## 2022-08-24 ENCOUNTER — Telehealth: Payer: Self-pay

## 2022-08-24 DIAGNOSIS — C20 Malignant neoplasm of rectum: Secondary | ICD-10-CM

## 2022-08-24 LAB — CEA (IN HOUSE-CHCC): CEA (CHCC-In House): 10.77 ng/mL — ABNORMAL HIGH (ref 0.00–5.00)

## 2022-08-24 NOTE — Progress Notes (Signed)
Imaging appts made.  I left kathy a vm to return my call to review appts and instructions. Pryor returned my call I reviewed the following instructions with her: MR Pelvis rectal ca protocol 10/5 arrive at [facility] 1130, MR Abd following.Prep instructions: Prednisone 50 mg on 10/4 at 2300, 10/5 at 0500 and 10/5 at 1100 with benadryl 50 mg.  Fleets enema at 1000, npo after 0800. C Chest is 10/6 arrive at 1000 for 1030 scan at [facility].  All questions were answered.  She verbalized understanding.

## 2022-08-24 NOTE — Progress Notes (Signed)
I spoke with [name], daughter of [name].  She is his caregiver.  I discussed with her his needing to take prednisone and benadryl prior to his MRI.  I confirmed the allergy and pharmacy.  I reviewed instructions: prednisone 13 hr, 7 hr and 1 hr prior to scan and benadryl 1 hour prior to scan. I told her that after the MRI's are scheduled I would call her to reviewed the times he should take this medication.  I also advised her that he will need to take a fleets enema prior to the MRI. All questions were answered.  She verbalized understanding.

## 2022-08-24 NOTE — Telephone Encounter (Signed)
-----   Message from Daryel November, MD sent at 08/23/2022  7:57 AM EDT ----- Regarding: MRI pelvis Vaughan Basta,  Can you please order an MRI pelvis (rectal cancer protocol) for Mr. Cinquemani?  Thanks

## 2022-08-24 NOTE — Progress Notes (Signed)
Referral, consult note, demographics, and insurance information faxed to [facility]. 

## 2022-08-24 NOTE — Progress Notes (Signed)
error 

## 2022-08-24 NOTE — Progress Notes (Signed)
[place] Work  Initial Assessment   [name] is a 81 y.o. year old male accompanied by patient, daughter, and wife. Clinical Social Work was referred by medical provider for assessment of psychosocial needs.   SDOH (Social Determinants of Health) assessments performed: Yes   SDOH Screenings   Depression (PHQ2-9): High Risk (06/28/2022)  Tobacco Use: Low Risk  (08/23/2022)     Distress Screen completed: No     No data to display            Family/Social Information:  Housing Arrangement: patient lives with spouse,  Marlou Sa Family members/support persons in your life? Pt's daughter Juliann Pulse) lives nearby and is very involved in pt's healthcare Transportation concerns: no  Employment: Retired .  Income source: Paediatric nurse concerns: No Type of concern:  None at this time, pt informed of [facility] should any financial concerns arise. Food access concerns: no Religious or spiritual practice: Yes-[employer] [facility] Currently in place:  social security retirement/Medicare  Coping/ Adjustment to diagnosis: Patient understands treatment plan and what happens next? yes Concerns about diagnosis and/or treatment: Quality of life Patient reported stressors: Physical issues and retaining independence.  Per pt prior to 3 months ago he was very physically active and independent; however, he now is very fatigued and is struggling with pain and incontinence. Hopes and/or priorities: Pt's priority is to start treatment w/ the hope of regaining some physical strength and independence. Patient enjoys time with family/ friends Current coping skills/ strengths: Motivation for treatment/growth  and Supportive family/friends     SUMMARY: Current SDOH Barriers:  No barriers identified at this time.  Clinical Social Work Clinical Goal(s):  No clinical social work goals at this time  Interventions: Discussed common feeling and emotions when being  diagnosed with cancer, and the importance of support during treatment Informed patient of the support team roles and support services at [facility] Provided [place] contact information and encouraged patient to call with any questions or concerns Pt declined supportive services as well as any assistance in the home as he prefers independence and is more comfortable surrounded by family.   Follow Up Plan: Patient will contact CSW with any support or resource needs Patient verbalizes understanding of plan: Yes    Henriette Combs, LCSW

## 2022-08-24 NOTE — Telephone Encounter (Signed)
Order in epic, Rad scheduling will contact pt with appt.

## 2022-08-25 LAB — OVA AND PARASITE EXAMINATION
CONCENTRATE RESULT:: NONE SEEN
MICRO NUMBER:: 13931117
SPECIMEN QUALITY:: ADEQUATE
TRICHROME RESULT:: NONE SEEN

## 2022-08-25 NOTE — Progress Notes (Unsigned)
HPI: KeithKeith [name] is a 81 y.o. male, who is here today with her daughter and wife here for follow up.  He was last seen on 06/28/22. Since his last visit,he had colonoscopy done and has established with oncologist. Dx'ed with rectal cancer. Still having LUQ abdominal pain and bloating sensation with distention. No changes in bowel habits, melena,or blood in stool.  Pending abdominal and chest CT 09/03/22 for staging  HTN: He is not taking Lisinopril.  Lab Results  Component Value Date   CREATININE 0.91 08/23/2022   BUN 19 08/23/2022   NA 138 08/23/2022   K 4.3 08/23/2022   CL 108 08/23/2022   CO2 25 08/23/2022   HLD: Stopped Atorvastatin.  He is c/o LUE pain, constant, severe,interfering with sleep. From under shoulder to elbow, tingling sensation. He has not identified exacerbating or alleviating factors. No neck pain. He has not noted rash.  Cervical CT on 06/09/22:  1. No acute fracture or traumatic subluxation of the cervical spine.  2. Multilevel degenerative changes as described above with multilevel neural foraminal stenosis. He is taking Gabapentin 200 mg om and 100 mg am.  Insomnia: Sleeping 3-4 hours. Lorazepam 0.5 mg at bedtime is not longer helping. Anxiety and depression: He has been on Lorazepam for years. On medication list is Sertraline 50 mg, not sure if he is taking it.    08/27/2022    9:45 AM 06/28/2022    8:06 AM  Depression screen PHQ 2/9  Decreased Interest 3 3  Down, Depressed, Hopeless 2 3  PHQ - 2 Score 5 6  Altered sleeping 3 3  Tired, decreased energy 3 3  Change in appetite 2 3  Feeling bad or failure about yourself  1 2  Trouble concentrating 3 3  Moving slowly or fidgety/restless 2 3  Suicidal thoughts 0 0  PHQ-9 Score 19 23  Difficult doing work/chores Extremely dIfficult Very difficult   Review of Systems  Constitutional:  Positive for appetite change and fatigue. Negative for activity change and fever.  HENT:   Negative for mouth sores and sore throat.   Respiratory:  Negative for cough, shortness of breath and wheezing.   Cardiovascular:  Negative for chest pain and palpitations.  Gastrointestinal:  Negative for nausea and vomiting.  Musculoskeletal:  Positive for arthralgias.  Neurological:  Negative for syncope and headaches.  Psychiatric/Behavioral:  Positive for sleep disturbance. Negative for confusion and hallucinations. The patient is nervous/anxious.   Rest see pertinent positives and negatives per HPI.  Current Outpatient Medications on File Prior to Visit  Medication Sig Dispense Refill   aspirin 81 MG tablet Take 81 mg by mouth daily.     diphenhydrAMINE (BENADRYL) 50 MG tablet Take 1 hour before ct scan 30 tablet 0   dorzolamide (TRUSOPT) 2 % ophthalmic solution 1 drop 3 (three) times daily.     dorzolamide-timolol (COSOPT) 22.3-6.8 MG/ML ophthalmic solution Place 1 drop into both eyes daily.     loperamide (IMODIUM A-D) 2 MG tablet Take 1 tablet (2 mg total) by mouth 3 (three) times daily as needed for diarrhea or loose stools. 60 tablet 3   LORazepam (ATIVAN) 0.5 MG tablet Take 1 tablet by mouth as needed for sleep. (Patient not taking: Reported on 08/13/2022)     LUMIGAN 0.01 % SOLN Place 1 drop into both eyes daily.     nitroGLYCERIN (NITROSTAT) 0.4 MG SL tablet Place 1 tablet (0.4 mg total) under the tongue every 5 (five) minutes x 3  doses as needed for chest pain. (Patient not taking: Reported on 08/13/2022) 25 tablet 2   omeprazole (PRILOSEC) 40 MG capsule Take 1 capsule (40 mg total) by mouth daily. 30 capsule 1   predniSONE (DELTASONE) 50 MG tablet Take 1 tab 13 hours before the CT scan Take 1 tab 7 hours before the CT scan Take 1 tab 1 hour before the CT scan 3 tablet 0   sertraline (ZOLOFT) 50 MG tablet Take 50 mg by mouth daily.     No current facility-administered medications on file prior to visit.   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 10 years after inital implant, 07/25/2008 treated with 2 overlapping DES   Depression    Elevated cholesterol    Gallstones    Glaucoma    Hyperlipidemia    with low high-density lipoprotein   Irritable bowel syndrome    Kidney stone    Kidney stones    Allergies  Allergen Reactions   Gadolinium Derivatives Hives   Multihance [Gadobenate] Hives    [name]   Iodine Rash   Social History   Socioeconomic History   Marital status: Married    Spouse name: Not on file   Number of children: 3   Years of education: Not on file   Highest education level: Not on file  Occupational History   Not on file  Tobacco Use   Smoking status: Never   Smokeless tobacco: Never  Vaping Use   Vaping Use: Never used  Substance and Sexual Activity   Alcohol use: Not Currently    Comment: he used to drink heavily for many years   Drug use: No   Sexual activity: Not on file  Other Topics Concern   Not on file  Social History Narrative   Not on file   Social Determinants of Health   Financial Resource Strain: Not on file  Food Insecurity: Not on file  Transportation Needs: Not on file  Physical Activity: Not on file  Stress: Not on file  Social Connections: Not on file   Vitals:   08/27/22 0924  BP: 127/88  Pulse: 90  Resp: 18  SpO2: 100%   Body mass index is 22.21 kg/m.  Physical Exam Vitals and nursing note reviewed.  Constitutional:      General: He is not in acute distress.    Appearance: He is well-developed.  HENT:     Head: Normocephalic and atraumatic.  Eyes:     Conjunctiva/sclera: Conjunctivae normal.  Cardiovascular:     Rate and Rhythm: Normal rate and regular rhythm.     Heart sounds: No murmur heard. Pulmonary:     Effort: Pulmonary effort is normal. No respiratory distress.     Breath sounds:  Normal breath sounds.  Abdominal:     Palpations: Abdomen is soft. There is no mass.     Tenderness: There is no abdominal tenderness.  Musculoskeletal:     Left shoulder: Tenderness (with movement, no with palpation.) present. No bony tenderness or crepitus. Decreased range of motion (Mild).  Lymphadenopathy:     Cervical: No cervical adenopathy.  Skin:    General: Skin is warm.     Findings: No erythema or rash.  Neurological:     Mental Status: He is alert  and oriented to person, place, and time.     Cranial Nerves: No cranial nerve deficit.     Gait: Gait normal.     Comments: Mildly unstable gait, not assisted.  Psychiatric:        Mood and Affect: Mood is anxious.   ASSESSMENT AND PLAN:  KeithEric was seen today for follow-up.  Diagnoses and all orders for this visit: Orders Placed This Encounter  Procedures   Flu Vaccine QUAD High Dose(Fluad)   Left arm pain Cervical radiculopathy, we discussed other possible causes. We discussed options, at this time I recommend conservative management.He and his family agrees with plan. Gabapentin increased from 100 mg am and 200 mg pm to 300 mg bid. Hydrocodone-Acetaminophen 5-325 mg at bedtime. We discussed side effects of mediations and the risk when taking benzos and opioids. F/U in 4 weeks,before if needed.  -     gabapentin (NEURONTIN) 300 MG capsule; Take 1 capsule (300 mg total) by mouth 2 (two) times daily. -     HYDROcodone-acetaminophen (NORCO/VICODIN) 5-325 MG tablet; Take 1 tablet by mouth at bedtime as needed for moderate pain.  Insomnia, unspecified type Pain is aggravating problem. Continue Lorazepam 0.5 mg at bedtime and Gabapentin 300 mg at bedtime.  Depression, major, recurrent, moderate ([place]) He is not sure if he is taking Sertraline 50 mg, his daughter will check his meds and let us know. For anxiety he can continue Lorazepam 0.5 mg at bedtime.  Rectal cancer ([place]) Pending abdominal and chest  CT. Following with oncologist.  Primary hypertension BP adequately controlled. Continue non pharmacologic treatment.  Need for immunization against influenza -     Flu Vaccine QUAD High Dose(Fluad)  Return in about 4 weeks (around 09/24/2022).  Neela Zecca G. Martinique, MD  [facility]. [place] office.

## 2022-08-25 NOTE — Progress Notes (Signed)
Appointment calender mailed to [facility].

## 2022-08-27 ENCOUNTER — Encounter: Payer: Self-pay | Admitting: Family Medicine

## 2022-08-27 ENCOUNTER — Ambulatory Visit: Payer: Medicare HMO | Admitting: Family Medicine

## 2022-08-27 ENCOUNTER — Other Ambulatory Visit: Payer: Self-pay | Admitting: Family Medicine

## 2022-08-27 VITALS — BP 127/88 | HR 90 | Resp 18 | Ht 71.0 in | Wt 159.2 lb

## 2022-08-27 DIAGNOSIS — G4700 Insomnia, unspecified: Secondary | ICD-10-CM

## 2022-08-27 DIAGNOSIS — I10 Essential (primary) hypertension: Secondary | ICD-10-CM

## 2022-08-27 DIAGNOSIS — F331 Major depressive disorder, recurrent, moderate: Secondary | ICD-10-CM | POA: Diagnosis not present

## 2022-08-27 DIAGNOSIS — Z23 Encounter for immunization: Secondary | ICD-10-CM | POA: Diagnosis not present

## 2022-08-27 DIAGNOSIS — M79602 Pain in left arm: Secondary | ICD-10-CM | POA: Diagnosis not present

## 2022-08-27 DIAGNOSIS — C20 Malignant neoplasm of rectum: Secondary | ICD-10-CM

## 2022-08-27 MED ORDER — GABAPENTIN 300 MG PO CAPS: 300.0000 mg | ORAL_CAPSULE | Freq: Two times a day (BID) | ORAL | 3 refills | Status: AC

## 2022-08-27 MED ORDER — HYDROCODONE-ACETAMINOPHEN 5-325 MG PO TABS: 1.0000 | ORAL_TABLET | Freq: Every evening | ORAL | 0 refills | Status: AC | PRN

## 2022-08-27 NOTE — Patient Instructions (Addendum)
A few things to remember from today's visit:   Need for influenza vaccination - Plan: Flu Vaccine QUAD High Dose(Fluad)  Need for immunization against influenza - Plan: Flu Vaccine QUAD High Dose(Fluad)  Left arm pain - Plan: gabapentin (NEURONTIN) 300 MG capsule, HYDROcodone-acetaminophen (NORCO/VICODIN) 5-325 MG tablet  Insomnia, unspecified type  Depression, major, recurrent, moderate (HCC)  Today we increased dose of Gabapentin to 300 mg 2 times daily. We added Hydrocodone to help with pain at night and may help with sleep. No changes in Lorazepam. Be sure you are taking Sertraline.  Fall precautions.  If you need refills for medications you take chronically, please call your pharmacy. Do not use My Chart to request refills or for acute issues that need immediate attention. If you send a my chart message, it may take a few days to be addressed, specially if I am not in the office.  Please be sure medication list is accurate. If a new problem present, please set up appointment sooner than planned today.

## 2022-08-27 NOTE — Telephone Encounter (Signed)
Pt seeking clarification on whether her father should be taking LORazepam (ATIVAN) 0.5 MG tablet

## 2022-08-28 NOTE — Telephone Encounter (Signed)
He is to continue current dose, Lorazepam 0.5 mg at bedtime. Thanks, BJ

## 2022-08-30 MED ORDER — LORAZEPAM 0.5 MG PO TABS: 0.5000 mg | ORAL_TABLET | ORAL | 3 refills | Status: AC | PRN

## 2022-08-30 NOTE — Telephone Encounter (Signed)
I spoke with pt's daughter, she is aware he is to continue his current dosage & that a new Rx has been sent in.

## 2022-09-02 ENCOUNTER — Ambulatory Visit (HOSPITAL_COMMUNITY)
Admission: RE | Admit: 2022-09-02 | Discharge: 2022-09-02 | Disposition: A | Discharge status: Home / Self Care | Payer: Medicare HMO | Source: Ambulatory Visit | Attending: Hematology | Admitting: Hematology

## 2022-09-02 DIAGNOSIS — K7460 Unspecified cirrhosis of liver: Secondary | ICD-10-CM | POA: Insufficient documentation

## 2022-09-02 DIAGNOSIS — C20 Malignant neoplasm of rectum: Secondary | ICD-10-CM | POA: Diagnosis present

## 2022-09-02 MED ORDER — GADOPICLENOL 0.5 MMOL/ML IV SOLN
7.0000 mL | Freq: Once | INTRAVENOUS | Status: AC | PRN
  Administered 2022-09-02: 7 mL via INTRAVENOUS

## 2022-09-03 ENCOUNTER — Ambulatory Visit (HOSPITAL_COMMUNITY): Payer: Medicare HMO

## 2022-09-07 ENCOUNTER — Other Ambulatory Visit: Payer: Self-pay

## 2022-09-07 ENCOUNTER — Inpatient Hospital Stay: Payer: Medicare HMO | Attending: Hematology | Admitting: Hematology

## 2022-09-07 ENCOUNTER — Encounter: Payer: Self-pay | Admitting: Hematology

## 2022-09-07 VITALS — BP 123/80 | HR 60 | Temp 98.2°F | Resp 18 | Ht 71.0 in | Wt 156.3 lb

## 2022-09-07 DIAGNOSIS — R531 Weakness: Secondary | ICD-10-CM | POA: Insufficient documentation

## 2022-09-07 DIAGNOSIS — C20 Malignant neoplasm of rectum: Secondary | ICD-10-CM

## 2022-09-07 DIAGNOSIS — R5383 Other fatigue: Secondary | ICD-10-CM | POA: Diagnosis not present

## 2022-09-07 DIAGNOSIS — K7460 Unspecified cirrhosis of liver: Secondary | ICD-10-CM | POA: Insufficient documentation

## 2022-09-07 DIAGNOSIS — K922 Gastrointestinal hemorrhage, unspecified: Secondary | ICD-10-CM

## 2022-09-07 DIAGNOSIS — C787 Secondary malignant neoplasm of liver and intrahepatic bile duct: Secondary | ICD-10-CM | POA: Insufficient documentation

## 2022-09-07 DIAGNOSIS — R109 Unspecified abdominal pain: Secondary | ICD-10-CM | POA: Diagnosis not present

## 2022-09-07 DIAGNOSIS — Z79899 Other long term (current) drug therapy: Secondary | ICD-10-CM | POA: Diagnosis not present

## 2022-09-07 DIAGNOSIS — R630 Anorexia: Secondary | ICD-10-CM | POA: Insufficient documentation

## 2022-09-07 DIAGNOSIS — R188 Other ascites: Secondary | ICD-10-CM | POA: Diagnosis not present

## 2022-09-07 MED ORDER — OMEPRAZOLE 40 MG PO CPDR: 40.0000 mg | DELAYED_RELEASE_CAPSULE | Freq: Every day | ORAL | 1 refills | Status: DC

## 2022-09-07 MED ORDER — DRONABINOL 2.5 MG PO CAPS: 2.5000 mg | ORAL_CAPSULE | Freq: Two times a day (BID) | ORAL | 0 refills | Status: DC

## 2022-09-07 MED ORDER — TRAMADOL HCL 50 MG PO TABS: 50.0000 mg | ORAL_TABLET | Freq: Four times a day (QID) | ORAL | 0 refills | Status: AC | PRN

## 2022-09-07 MED ORDER — CAPECITABINE 500 MG PO TABS
ORAL_TABLET | ORAL | 0 refills | Status: DC
  Filled 2022-09-07: qty 84, fill #0

## 2022-09-07 NOTE — Progress Notes (Addendum)
[place]   [phone] [phone]) [phone]   Clinic Follow up Note   Patient Care Team: Martinique, Betty G, MD as PCP - General (Family Medicine) Burnell Blanks, MD as PCP - Cardiology (Cardiology) Truitt Merle, MD as Consulting Physician (Oncology) Royston Bake, RN as Oncology Nurse Navigator (Oncology) [place] as Consulting Physician [facility] and Palliative Medicine)  Date of Service:  09/07/2022  CHIEF COMPLAINT: f/u of rectal cancer  CURRENT THERAPY:  Pending radiation   ASSESSMENT & PLAN:  Jaysen Wey is a 81 y.o. male with   1. 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 Colonoscopy on 08/16/22 by Dr. Candis Schatz showed partially obstructing tumor in proximal rectum; path confirmed invasive moderately differentiated adenocarcinoma.  -CT AP without contrast 07/02/22 showed only new left hepatic cirrhosis, no rectal mass, adenopathy or evidence of distant metastasis.  However the scan was done without contrast, which limits the evaluation. -elevated tumor markers-- baseline AFP 08/13/22 at 111.8, repeat CEA on 08/23/22 at 10.77. -staging chest CT 09/02/22 showed: mildly enlarged left internal mammary lymph node; three small pulmonary nodules in lung bases, up to 0.5 cm. Same day abdomen MRI showed: diffuse hepatic involvement by numerous masses, largest nearly replacing left hepatic lobe; porta hepatis lymphadenopathy; moderate ascites. Staged by pelvis MRI as T3b, N1, located 8.9 cm from anal sphincter. --I reviewed the results with the patient and his family today. I explained that the extensive liver metastases mean he is not eligible for surgery and his disease is not curable. I discussed that it is treatable, but I expressed concern that this would be hard on him, especially given his poor PS  and advanced age. We discussed his goals of care, to see if he has any preference of quality of life vs quantity of life. He states he wants to do anything. I reviewed options of IV chemotherapy, oral chemotherapy, and radiation therapy. I do not feel he is a candidate for intensive chemo especially iv chemo -He is quite symptomatic from rectal tumor and liver metastasis, I recommend palliative radiation to his rectal tumor first, hopefully a short course with 1-2 weeks -We discussed the option of oral chemotherapy capecitabine, he is willing to try.  Potential side effect and benefit discussed with him, will start after he completed radiation.   2.  Symptom Management: abdominal pain, low appetite, fatigue -I will call in tramadol for better pain control and marinol to help with appetite.  3. Newly diagnosed liver cirrhosis, heavy alcohol drinking history -liver cirrhosis found on initial CT on 07/02/22 -Follow-up with GI Dr. Candis Schatz    PLAN:  -I called in marinol and tramadol -We will make a referral to palliative care NP [facility], next week. -Referred to radiation oncology Dr. Lisbeth Renshaw, he is scheduled to see them tomorrow -I will call in Xeloda today, plan to start after he completes palliative radiation. -lab and f/u in 2 weeks -I will request MMR and FO on her biopsy sample.    No problem-specific Assessment & Plan notes found for this encounter.   SUMMARY OF ONCOLOGIC HISTORY: Oncology History  Rectal cancer ([place])  07/02/2022 Imaging   CLINICAL DATA:  Left upper quadrant abdominal pain abnormal LFTs   EXAM: CT ABDOMEN AND PELVIS WITHOUT CONTRAST  IMPRESSION: Shrunken nodular contour of the left hepatic lobe compatible with left hepatic lobe cirrhosis. This finding is new  since 2021. Further evaluation with CT or MRI with IV contrast is recommended.   08/13/2022 Tumor Marker   Patient's tumor was tested for the following markers: AFP. Results of the tumor marker test revealed  elevation (111.8).   08/16/2022 Procedure   Colonoscopy, Dr. Candis Schatz  Impression: - Preparation of the colon was inadequate. - Hemorrhoids found on perianal exam. - Malignant partially obstructing tumor in the proximal rectum. Biopsied. Tattooed.   08/16/2022 Initial Biopsy   3. Rectum, biopsy, mass - INVASIVE MODERATELY DIFFERENTIATED ADENOCARCINOMA   08/23/2022 Initial Diagnosis   Rectal cancer ([place])   09/02/2022 Imaging   EXAM: CT CHEST WITHOUT CONTRAST  IMPRESSION: 1. Mildly enlarged left internal mammary lymph node, suspicious for nodal metastasis. 2. Three small solid pulmonary nodules scattered at the lung bases bilaterally, largest 0.5 cm, cannot exclude pulmonary metastases. Recommend attention on follow-up chest CT in 3 months. 3. Extensive confluent hypodense liver masses throughout the visualized liver, poorly delineated on this noncontrast CT, suspicious for liver metastases, please see the separate same-day MRI abdomen report for details. 4. Small volume ascites. 5. Three-vessel coronary atherosclerosis. 6.  Aortic Atherosclerosis (ICD10-I70.0).   09/02/2022 Imaging   EXAM: MRI ABDOMEN WITHOUT AND WITH CONTRAST  IMPRESSION: Diffuse hepatic involvement by numerous masses, largest nearly replacing the left hepatic lobe and showing diffuse necrosis. Characteristics favor diffuse hepatic metastatic disease, however diffuse multifocal hepatocellular carcinoma cannot be excluded. Tissue sampling should be considered.   Porta hepatis lymphadenopathy, consistent with metastatic disease.   Moderate ascites.   Intrahepatic biliary ductal dilatation throughout the right hepatic lobe due to obstruction in the central liver hilum.   09/02/2022 Imaging   EXAM: MRI PELVIS WITHOUT CONTRAST, RECTAL CANCER STAGING  IMPRESSION: Rectal adenocarcinoma T stage: T3 B   Rectal adenocarcinoma N stage:  N1   Distance from tumor to the internal anal sphincter is 8.9 cm       INTERVAL HISTORY:  [place] is here for a follow up of metastatic rectal cancer. He was last seen by me on 08/23/22 in consultation. He presents to the clinic accompanied by his wife and daughter. He reports persistent fatigue.   All other systems were reviewed with the patient and are negative.  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, 07/25/2008 treated with 2 overlapping DES   Depression    Elevated cholesterol    Gallstones    Glaucoma    Hyperlipidemia    with low high-density lipoprotein   Irritable bowel syndrome    Kidney stone    Kidney stones     SURGICAL HISTORY: Past Surgical History:  Procedure Laterality Date   BACK SURGERY     CHOLECYSTECTOMY     CORONARY STENT INTERVENTION N/A 02/20/2021   Procedure: CORONARY STENT INTERVENTION;  Surgeon: Leonie Man, MD;  Location: [facility];  Service: Cardiovascular;  Laterality: N/A;   CORONARY STENT PLACEMENT     2 overlapping Promus DES (covering existing BMS) in RCA: Promus DES 3.5x23 (x 2) -> post-dilated to 3.75  mm   LEFT HEART CATH AND CORONARY ANGIOGRAPHY N/A 02/20/2021   Procedure: LEFT HEART CATH AND CORONARY ANGIOGRAPHY;  Surgeon: Leonie Man, MD;  Location: [place] [facility];  Service: Cardiovascular;  Laterality: N/A;  I have reviewed the social history and family history with the patient and they are unchanged from previous note.  ALLERGIES:  is allergic to gadolinium derivatives, multihance [gadobenate], and iodine.  MEDICATIONS:  Current Outpatient Medications  Medication Sig Dispense Refill   dronabinol (MARINOL) 2.5 MG capsule Take 1 capsule (2.5 mg total) by mouth 2 (two) times daily before a meal. 20 capsule 0   traMADol (ULTRAM) 50 MG tablet Take 1  tablet (50 mg total) by mouth every 6 (six) hours as needed. 20 tablet 0   aspirin 81 MG tablet Take 81 mg by mouth daily.     diphenhydrAMINE (BENADRYL) 50 MG tablet Take 1 hour before ct scan 30 tablet 0   dorzolamide (TRUSOPT) 2 % ophthalmic solution 1 drop 3 (three) times daily.     dorzolamide-timolol (COSOPT) 22.3-6.8 MG/ML ophthalmic solution Place 1 drop into both eyes daily.     gabapentin (NEURONTIN) 300 MG capsule Take 1 capsule (300 mg total) by mouth 2 (two) times daily. 60 capsule 3   HYDROcodone-acetaminophen (NORCO/VICODIN) 5-325 MG tablet Take 1 tablet by mouth at bedtime as needed for moderate pain. 30 tablet 0   loperamide (IMODIUM A-D) 2 MG tablet Take 1 tablet (2 mg total) by mouth 3 (three) times daily as needed for diarrhea or loose stools. 60 tablet 3   LORazepam (ATIVAN) 0.5 MG tablet Take 1 tablet (0.5 mg total) by mouth as needed for sleep. 30 tablet 3   LUMIGAN 0.01 % SOLN Place 1 drop into both eyes daily.     nitroGLYCERIN (NITROSTAT) 0.4 MG SL tablet Place 1 tablet (0.4 mg total) under the tongue every 5 (five) minutes x 3 doses as needed for chest pain. (Patient not taking: Reported on 08/13/2022) 25 tablet 2   omeprazole (PRILOSEC) 40 MG capsule Take 1 capsule (40 mg total) by mouth daily. 30 capsule 1   predniSONE (DELTASONE) 50 MG tablet Take 1 tab 13 hours before the CT scan Take 1 tab 7 hours before the CT scan Take 1 tab 1 hour before the CT scan 3 tablet 0   sertraline (ZOLOFT) 50 MG tablet Take 50 mg by mouth daily.     No current facility-administered medications for this visit.    PHYSICAL EXAMINATION: ECOG PERFORMANCE STATUS: 3 - Symptomatic, >50% confined to bed  Vitals:   09/07/22 1125  BP: 123/80  Pulse: 60  Resp: 18  Temp: 98.2 F (36.8 C)  SpO2: 100%   Wt Readings from Last 3 Encounters:  09/07/22 156 lb 4.8 oz (70.9 kg)  08/27/22 159 lb 4 oz (72.2 kg)  08/16/22 157 lb (71.2 kg)     GENERAL:alert, no distress and comfortable SKIN:  skin color normal, no rashes or significant lesions EYES: normal, Conjunctiva are pink and non-injected, sclera clear  NEURO: alert & oriented x 3 with fluent speech  LABORATORY DATA:  I have reviewed the data as listed    Latest Ref Rng & Units 08/23/2022    4:14 PM 06/28/2022    9:00 AM 06/09/2022    5:21 PM  CBC  WBC 4.0 - 10.5 K/uL 5.4  7.1  7.8   Hemoglobin 13.0 - 17.0 g/dL 13.0  13.4  12.9   Hematocrit 39.0 - 52.0 % 38.7  40.6  40.6   Platelets 150 - 400 K/uL 173  225.0  270         Latest Ref Rng & Units 08/23/2022    4:14 PM 08/13/2022  9:56 AM 06/09/2022    5:21 PM  CMP  Glucose 70 - 99 mg/dL 117   134   BUN 8 - 23 mg/dL 19   17   Creatinine 0.61 - 1.24 mg/dL 0.91   1.11   Sodium 135 - 145 mmol/L 138   136   Potassium 3.5 - 5.1 mmol/L 4.3   4.4   Chloride 98 - 111 mmol/L 108   102   CO2 22 - 32 mmol/L 25   24   Calcium 8.9 - 10.3 mg/dL 8.7   9.0   Total Protein 6.5 - 8.1 g/dL 6.8  7.3  7.4   Total Bilirubin 0.3 - 1.2 mg/dL 1.3  1.8  1.6   Alkaline Phos 38 - 126 U/L 461  490  491   AST 15 - 41 U/L 68  62  193   ALT 0 - 44 U/L 53  55  313       RADIOGRAPHIC STUDIES: I have personally reviewed the radiological images as listed and agreed with the findings in the report. No results found.    Orders Placed This Encounter  Procedures   Ambulatory referral to Radiation Oncology    Referral Priority:   Urgent    Referral Type:   Consultation    Referral Reason:   Specialty Services Required    Requested Specialty:   Radiation Oncology    Number of Visits Requested:   1   Amb Referral to Palliative Care    Referral Priority:   Urgent    Referral Type:   Consultation    Referral Reason:   Symptom Managment    Number of Visits Requested:   1   All questions were answered. The patient knows to call the clinic with any problems, questions or concerns. No barriers to learning was detected. The total time spent in the appointment was 40 minutes.     Truitt Merle,  MD 09/07/2022   I, Wilburn Mylar, am acting as scribe for Truitt Merle, MD.   I have reviewed the above documentation for accuracy and completeness, and I agree with the above.

## 2022-09-08 ENCOUNTER — Ambulatory Visit: Payer: Medicare HMO

## 2022-09-08 ENCOUNTER — Telehealth: Payer: Self-pay | Admitting: Pharmacy Technician

## 2022-09-08 ENCOUNTER — Other Ambulatory Visit (HOSPITAL_COMMUNITY): Payer: Self-pay

## 2022-09-08 ENCOUNTER — Other Ambulatory Visit: Payer: Self-pay

## 2022-09-08 ENCOUNTER — Telehealth: Payer: Self-pay | Admitting: Pharmacist

## 2022-09-08 ENCOUNTER — Ambulatory Visit: Payer: Medicare HMO | Admitting: Radiation Oncology

## 2022-09-08 DIAGNOSIS — C20 Malignant neoplasm of rectum: Secondary | ICD-10-CM

## 2022-09-08 MED ORDER — CAPECITABINE 500 MG PO TABS
ORAL_TABLET | ORAL | 0 refills | Status: DC
  Filled 2022-09-08 (×2): qty 84, fill #0
  Filled 2022-09-10: qty 84, 21d supply, fill #0

## 2022-09-08 NOTE — Telephone Encounter (Signed)
Oral Oncology Patient Advocate Encounter  Prior Authorization for Capecitabine has been approved.  PA# [id number] Effective dates: 09/08/2022 through 11/29/2023  Patients co-pay is $28.91.    Lady Deutscher, CPhT-Adv Oncology Pharmacy Patient [place] Direct Number: [phone]  Fax: [phone]

## 2022-09-08 NOTE — Telephone Encounter (Signed)
Oral Oncology Patient Advocate Encounter   Received notification that prior authorization for Capecitabine is required.   PA submitted on 09/08/2022 Key [id number] Status is pending     [name], CPhT-Adv Oncology Pharmacy Patient [place] Direct Number: [phone]  Fax: [phone]

## 2022-09-08 NOTE — Telephone Encounter (Signed)
Oral Oncology Pharmacist Encounter  Received new prescription for Xeloda (capecitabine) for the treatment of metastatic rectal cancer, planned duration until disease progression or unacceptable drug toxicity.  CMP and CBC w/ Diff from 08/23/22 assessed, noted pt with LFT elevation (AST 65 U/L, ALT 53 U/L). No baseline hepatic dose adjustments required. Prescription dose and frequency assessed for appropriateness. Patient will not start until after the completion of radiation therapy.   Current medication list in Epic reviewed, DDIs with Xeloda identified Category C DDI between Xeloda and Omeprazole - mixed studies have shown that proton-pump inhibitors may decrease efficacy of Xeloda - given that patient has intermittent hematochezia per MD note, would not recommend discontinuation of PPI at this time.   Evaluated chart and no patient barriers to medication adherence noted.   Patient agreement for treatment documented in MD note on 09/07/22.  Prescription has been e-scribed to the [facility] for benefits analysis and approval.  Oral Oncology Clinic will continue to follow for insurance authorization, copayment issues, initial counseling and start date.  Leron Croak, PharmD, BCPS, [facility] Hematology/Oncology Clinical Pharmacist [facility] and [place] [phone] 09/08/2022 8:48 AM

## 2022-09-08 NOTE — Progress Notes (Signed)
Epic faxed and normal faxed pt's Palliative referral order, pt demographics, and Dr. Ernestina Penna last office note to [facility].  Fax confirmations received for both.

## 2022-09-09 ENCOUNTER — Other Ambulatory Visit (HOSPITAL_COMMUNITY): Payer: Self-pay

## 2022-09-11 ENCOUNTER — Other Ambulatory Visit (HOSPITAL_COMMUNITY): Payer: Self-pay

## 2022-09-13 ENCOUNTER — Other Ambulatory Visit (HOSPITAL_COMMUNITY): Payer: Self-pay

## 2022-09-13 NOTE — Progress Notes (Signed)
GI Location of Tumor / Histology: Rectal Cancer  [facility] presented with complaints of frequent bowel movements, intermittent hematochezia and abdominal pain.  MRI Pelvis 09/02/2022:  T3b, N1, located 8.9 cm from the anal sphincter.  MRI Abd 09/02/2022: Diffuse hepatic involvement by numerous masses, largest nearly replacing left hepatic lobe, porta hepatis lymphadenopathy, moderate ascites.  CT Chest 09/02/2022: Mildly enlarged left internal mammary lymph node, three small pulmonary nodules in lung bases,  up to 0.5 cm.  Colonoscopy 08/16/2022: Partially obstructing tumor in the proximal rectum.  CT AP 07/02/2022: New left hepatic cirrhosis, no rectal mass, adenopathy or evidence of distant metastasis.   Biopsies of Rectal Mass 08/16/2022    Past/Anticipated interventions by surgeon, if any:    Past/Anticipated interventions by medical oncology, if any:  Dr. Burr Medico 09/07/2022 --I reviewed the results with the patient and his family today. I explained that the extensive liver metastases mean he is not eligible for surgery and his disease is not curable. - I do not feel he is a candidate for intensive chemo especially iv chemo. -He is quite symptomatic from rectal tumor and liver metastasis, I recommend palliative radiation to his rectal tumor first, hopefully a short course with 1-2 weeks -We discussed the option of oral chemotherapy capecitabine, he is willing to try. -We will make a referral to palliative care NP [facility], next week. -I will call in Xeloda today, plan to start after he completes palliative radiation.    Weight changes, if any:   Bowel/Bladder complaints, if any:   Nausea / Vomiting, if any:   Pain issues, if any:    Any blood per rectum:     SAFETY ISSUES: Prior radiation?  Pacemaker/ICD?  Possible current pregnancy? N/A Is the patient on methotrexate?   Current Complaints/Details:

## 2022-09-14 ENCOUNTER — Inpatient Hospital Stay (HOSPITAL_BASED_OUTPATIENT_CLINIC_OR_DEPARTMENT_OTHER): Payer: Medicare HMO | Admitting: Nurse Practitioner

## 2022-09-14 ENCOUNTER — Telehealth: Payer: Self-pay | Admitting: *Deleted

## 2022-09-14 ENCOUNTER — Encounter: Payer: Self-pay | Admitting: Nurse Practitioner

## 2022-09-14 ENCOUNTER — Encounter: Payer: Self-pay | Admitting: Radiation Oncology

## 2022-09-14 ENCOUNTER — Other Ambulatory Visit (HOSPITAL_COMMUNITY): Payer: Self-pay

## 2022-09-14 ENCOUNTER — Ambulatory Visit
Admission: RE | Admit: 2022-09-14 | Discharge: 2022-09-14 | Disposition: A | Discharge status: Home / Self Care | Payer: Medicare HMO | Source: Ambulatory Visit | Attending: Radiation Oncology | Admitting: Radiation Oncology

## 2022-09-14 ENCOUNTER — Other Ambulatory Visit: Payer: Self-pay

## 2022-09-14 ENCOUNTER — Telehealth: Payer: Self-pay

## 2022-09-14 ENCOUNTER — Inpatient Hospital Stay: Payer: Medicare HMO

## 2022-09-14 ENCOUNTER — Other Ambulatory Visit: Payer: Self-pay | Admitting: Radiation Oncology

## 2022-09-14 VITALS — BP 109/71 | HR 72 | Temp 97.8°F | Resp 14 | Wt 158.6 lb

## 2022-09-14 VITALS — BP 109/69 | HR 72 | Temp 97.8°F | Resp 18 | Ht 71.0 in | Wt 158.4 lb

## 2022-09-14 DIAGNOSIS — R591 Generalized enlarged lymph nodes: Secondary | ICD-10-CM | POA: Insufficient documentation

## 2022-09-14 DIAGNOSIS — C20 Malignant neoplasm of rectum: Secondary | ICD-10-CM | POA: Insufficient documentation

## 2022-09-14 DIAGNOSIS — K7460 Unspecified cirrhosis of liver: Secondary | ICD-10-CM | POA: Insufficient documentation

## 2022-09-14 DIAGNOSIS — Z7982 Long term (current) use of aspirin: Secondary | ICD-10-CM | POA: Insufficient documentation

## 2022-09-14 DIAGNOSIS — K838 Other specified diseases of biliary tract: Secondary | ICD-10-CM | POA: Insufficient documentation

## 2022-09-14 DIAGNOSIS — C787 Secondary malignant neoplasm of liver and intrahepatic bile duct: Secondary | ICD-10-CM | POA: Diagnosis not present

## 2022-09-14 DIAGNOSIS — R188 Other ascites: Secondary | ICD-10-CM | POA: Diagnosis not present

## 2022-09-14 DIAGNOSIS — I2510 Atherosclerotic heart disease of native coronary artery without angina pectoris: Secondary | ICD-10-CM | POA: Diagnosis not present

## 2022-09-14 DIAGNOSIS — R772 Abnormality of alphafetoprotein: Secondary | ICD-10-CM | POA: Insufficient documentation

## 2022-09-14 DIAGNOSIS — E785 Hyperlipidemia, unspecified: Secondary | ICD-10-CM | POA: Diagnosis not present

## 2022-09-14 DIAGNOSIS — E7800 Pure hypercholesterolemia, unspecified: Secondary | ICD-10-CM | POA: Insufficient documentation

## 2022-09-14 DIAGNOSIS — R918 Other nonspecific abnormal finding of lung field: Secondary | ICD-10-CM | POA: Insufficient documentation

## 2022-09-14 DIAGNOSIS — Z79899 Other long term (current) drug therapy: Secondary | ICD-10-CM | POA: Diagnosis not present

## 2022-09-14 DIAGNOSIS — M47814 Spondylosis without myelopathy or radiculopathy, thoracic region: Secondary | ICD-10-CM | POA: Insufficient documentation

## 2022-09-14 DIAGNOSIS — N400 Enlarged prostate without lower urinary tract symptoms: Secondary | ICD-10-CM | POA: Insufficient documentation

## 2022-09-14 LAB — CBC WITH DIFFERENTIAL/PLATELET
Abs Immature Granulocytes: 0.05 K/uL (ref 0.00–0.07)
Basophils Absolute: 0 K/uL (ref 0.0–0.1)
Basophils Relative: 0 %
Eosinophils Absolute: 0.1 K/uL (ref 0.0–0.5)
Eosinophils Relative: 1 %
HCT: 37.6 % — ABNORMAL LOW (ref 39.0–52.0)
Hemoglobin: 12.9 g/dL — ABNORMAL LOW (ref 13.0–17.0)
Immature Granulocytes: 1 %
Lymphocytes Relative: 11 %
Lymphs Abs: 0.9 K/uL (ref 0.7–4.0)
MCH: 31.6 pg (ref 26.0–34.0)
MCHC: 34.3 g/dL (ref 30.0–36.0)
MCV: 92.2 fL (ref 80.0–100.0)
Monocytes Absolute: 0.6 K/uL (ref 0.1–1.0)
Monocytes Relative: 8 %
Neutro Abs: 6.5 K/uL (ref 1.7–7.7)
Neutrophils Relative %: 79 %
Platelets: 192 K/uL (ref 150–400)
RBC: 4.08 MIL/uL — ABNORMAL LOW (ref 4.22–5.81)
RDW: 16.4 % — ABNORMAL HIGH (ref 11.5–15.5)
WBC: 8.2 K/uL (ref 4.0–10.5)
nRBC: 0 % (ref 0.0–0.2)

## 2022-09-14 LAB — COMPREHENSIVE METABOLIC PANEL WITH GFR
ALT: 112 U/L — ABNORMAL HIGH (ref 0–44)
AST: 125 U/L — ABNORMAL HIGH (ref 15–41)
Albumin: 3 g/dL — ABNORMAL LOW (ref 3.5–5.0)
Alkaline Phosphatase: 705 U/L — ABNORMAL HIGH (ref 38–126)
Anion gap: 7 (ref 5–15)
BUN: 20 mg/dL (ref 8–23)
CO2: 25 mmol/L (ref 22–32)
Calcium: 8.7 mg/dL — ABNORMAL LOW (ref 8.9–10.3)
Chloride: 105 mmol/L (ref 98–111)
Creatinine, Ser: 0.95 mg/dL (ref 0.61–1.24)
GFR, Estimated: >60 mL/min
Glucose, Bld: 155 mg/dL — ABNORMAL HIGH (ref 70–99)
Potassium: 4.1 mmol/L (ref 3.5–5.1)
Sodium: 137 mmol/L (ref 135–145)
Total Bilirubin: 9.2 mg/dL (ref 0.3–1.2)
Total Protein: 6.4 g/dL — ABNORMAL LOW (ref 6.5–8.1)

## 2022-09-14 MED ORDER — DRONABINOL 2.5 MG PO CAPS: 2.5000 mg | ORAL_CAPSULE | Freq: Two times a day (BID) | ORAL | 0 refills | Status: DC

## 2022-09-14 NOTE — Telephone Encounter (Signed)
Called patient's daughter- Ceasar Mons to inform of [place] for 09-15-22- arrival time- 12:30 pm @ [facility] Main Entrance, no restrictions to test, lvm for a return call

## 2022-09-14 NOTE — Addendum Note (Signed)
Addended by: Alla Feeling on: 09/14/2022 02:10 PM   Modules accepted: Orders

## 2022-09-14 NOTE — Progress Notes (Addendum)
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 This finding is new since 2021. Further evaluation with CT or MRI with IV contrast is recommended.   08/13/2022 Tumor Marker   Patient's tumor was tested for the following markers: AFP. Results of the tumor marker test revealed elevation (111.8).   08/16/2022 Procedure   Colonoscopy, Dr. Candis Schatz  Impression: - Preparation of the colon was inadequate. - Hemorrhoids found on perianal exam. - Malignant partially obstructing tumor in the proximal rectum. Biopsied. Tattooed.   08/16/2022 Initial Biopsy   3. Rectum, biopsy, mass - INVASIVE MODERATELY DIFFERENTIATED ADENOCARCINOMA   08/16/2022 Cancer Staging   Staging form: Colon and Rectum, AJCC 8th Edition - Clinical stage from 08/16/2022: Stage IVA (cTX, cN1, cM1a) - Signed by Truitt Merle, MD on 09/07/2022 Histologic grade (G): GX Histologic grading system: 4 grade system   08/23/2022 Initial Diagnosis   Rectal cancer ([place])   09/02/2022 Imaging   EXAM: CT CHEST WITHOUT CONTRAST  IMPRESSION: 1. Mildly enlarged left internal mammary lymph node, suspicious for nodal metastasis. 2. Three  small solid pulmonary nodules scattered at the lung bases bilaterally, largest 0.5 cm, cannot exclude pulmonary metastases. Recommend attention on follow-up chest CT in 3 months. 3. Extensive confluent hypodense liver masses throughout the visualized liver, poorly delineated on this noncontrast CT, suspicious for liver metastases, please see the separate same-day MRI abdomen report for details. 4. Small volume ascites. 5. Three-vessel coronary atherosclerosis. 6.  Aortic Atherosclerosis (ICD10-I70.0).   09/02/2022 Imaging   EXAM: MRI ABDOMEN WITHOUT AND WITH CONTRAST  IMPRESSION: Diffuse hepatic involvement by numerous masses, largest nearly replacing the left hepatic lobe and showing diffuse necrosis. Characteristics favor diffuse hepatic metastatic disease, however diffuse multifocal hepatocellular carcinoma cannot be excluded. Tissue sampling should be considered.   Porta hepatis lymphadenopathy, consistent with metastatic disease.   Moderate ascites.   Intrahepatic biliary ductal dilatation throughout the right hepatic lobe due to obstruction in the central liver hilum.   09/02/2022 Imaging   EXAM: MRI PELVIS WITHOUT CONTRAST, RECTAL CANCER STAGING  IMPRESSION: Rectal adenocarcinoma T stage: T3 B   Rectal adenocarcinoma N stage:  N1   Distance from tumor to the internal anal sphincter is 8.9 cm     CURRENT THERAPY: Pending palliative radiation to liver and rectum  INTERVAL HISTORY: Mr. Wrightsman presents for symptom management visit.  Seen by colorectal surgeon Dr. Richardson Landry gross yesterday who messaged [place] about jaundice.  Patient also met with radiation team Dr. Lisbeth Renshaw and Bryson Ha, PA earlier this morning.  He is tired and ready to go home.  His wife and daughter are present for the visit, daughter tells me he has gotten weaker in the past week, not up out of chair much except to bathroom and shower.  Drinking multiple  ensures per day but not eating much else.  Abdomen is more  distended and tight.  Not taking much pain medication, he tried Norco but threw it up.  He is also hoarse from EGD last month.  We reviewed his upcoming cancer center/[facility] appointments including palliative care visit to his home on 10/19 and 10/23.  He remains interested in trying all available options for which he is legible.  Pharmacy notified them that Xeloda is ready but they have not picked it up yet.  Denies dizziness, fall, abdominal pain, fever, chills.  All other systems were reviewed with the patient and are negative.  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, 07/25/2008 treated with 2 overlapping DES   Depression    Elevated cholesterol    Gallstones    Glaucoma    Hyperlipidemia    with low high-density lipoprotein   Irritable bowel syndrome    Kidney stone    Kidney stones     SURGICAL HISTORY: Past Surgical History:  Procedure Laterality Date   BACK SURGERY     CHOLECYSTECTOMY     CORONARY STENT INTERVENTION N/A 02/20/2021   Procedure: CORONARY STENT INTERVENTION;  Surgeon: Leonie Man, MD;  Location: [place] [facility];  Service: Cardiovascular;  Laterality: N/A;   CORONARY STENT PLACEMENT     2 overlapping Promus DES (covering existing BMS) in RCA: Promus DES 3.5x23 (x 2) -> post-dilated to 3.75  mm   LEFT HEART CATH AND CORONARY ANGIOGRAPHY N/A 02/20/2021   Procedure: LEFT HEART CATH AND CORONARY ANGIOGRAPHY;  Surgeon: Leonie Man, MD;  Location: [place] [facility];  Service: Cardiovascular;  Laterality: N/A;    I have reviewed the social history and family history with the patient and they are unchanged from previous note.  ALLERGIES:  is allergic to gadolinium derivatives, multihance [gadobenate], and iodine.  MEDICATIONS:  Current Outpatient  Medications  Medication Sig Dispense Refill   aspirin 81 MG tablet Take 81 mg by mouth daily.     capecitabine (XELODA) 500 MG tablet Take 3 tablets by mouth every 12 hours. Take within 30 minutes after a meal. Take for 14 days, then off for 7 days. Repeat every 21 days. 84 tablet 0   diphenhydrAMINE (BENADRYL) 50 MG tablet Take 1 hour before ct scan 30 tablet 0   dorzolamide (TRUSOPT) 2 % ophthalmic solution 1 drop 3 (three) times daily.     dorzolamide-timolol (COSOPT) 22.3-6.8 MG/ML ophthalmic solution Place 1 drop into both eyes daily.     dronabinol (MARINOL) 2.5 MG capsule Take 1 capsule (2.5 mg total) by mouth 2 (two) times daily before a meal. 20 capsule 0   gabapentin (NEURONTIN) 300 MG capsule Take 1 capsule (300 mg total) by mouth 2 (two) times daily. 60 capsule 3   HYDROcodone-acetaminophen (NORCO/VICODIN) 5-325 MG tablet Take 1 tablet by mouth at bedtime as needed for moderate pain. 30 tablet 0   loperamide (IMODIUM A-D) 2 MG tablet Take 1 tablet (2 mg total) by mouth 3 (three) times daily as needed for diarrhea or loose stools. 60 tablet 3   LORazepam (ATIVAN) 0.5 MG tablet Take 1 tablet (0.5 mg total) by mouth as needed for sleep. 30 tablet 3   LUMIGAN 0.01 % SOLN Place 1  drop into both eyes daily.     nitroGLYCERIN (NITROSTAT) 0.4 MG SL tablet Place 1 tablet (0.4 mg total) under the tongue every 5 (five) minutes x 3 doses as needed for chest pain. (Patient not taking: Reported on 08/13/2022) 25 tablet 2   omeprazole (PRILOSEC) 40 MG capsule Take 1 capsule (40 mg total) by mouth daily. 30 capsule 1   predniSONE (DELTASONE) 50 MG tablet Take 1 tab 13 hours before the CT scan Take 1 tab 7 hours before the CT scan Take 1 tab 1 hour before the CT scan 3 tablet 0   sertraline (ZOLOFT) 50 MG tablet Take 50 mg by mouth daily.     traMADol (ULTRAM) 50 MG tablet Take 1 tablet (50 mg total) by mouth every 6 (six) hours as needed. 20 tablet 0   No current facility-administered medications for  this visit.    PHYSICAL EXAMINATION: ECOG PERFORMANCE STATUS: 3 - Symptomatic, >50% confined to bed  Vitals:   09/14/22 1122  BP: 109/71  Pulse: 72  Resp: 14  Temp: 97.8 F (36.6 C)  SpO2: 100%   Filed Weights   09/14/22 1122  Weight: 158 lb 9.6 oz (71.9 kg)    GENERAL:awake but tired, no distress, cachectic SKIN: juandice, dry  EYES: Scleral icterus LUNGS: clear with normal breathing effort HEART: regular rate & rhythm ABDOMEN:abdomen firm, distended consistent with ascites.  Palpable liver at the right abdomen  NEURO: alert & oriented x 3 with fluent speech, hoarseness, with generalized weakness   LABORATORY DATA:  I have reviewed the data as listed    Latest Ref Rng & Units 09/14/2022   10:21 AM 08/23/2022    4:14 PM 06/28/2022    9:00 AM  CBC  WBC 4.0 - 10.5 K/uL 8.2  5.4  7.1   Hemoglobin 13.0 - 17.0 g/dL 12.9  13.0  13.4   Hematocrit 39.0 - 52.0 % 37.6  38.7  40.6   Platelets 150 - 400 K/uL 192  173  225.0         Latest Ref Rng & Units 09/14/2022   10:21 AM 08/23/2022    4:14 PM 08/13/2022    9:56 AM  CMP  Glucose 70 - 99 mg/dL 155  117    BUN 8 - 23 mg/dL 20  19    Creatinine 0.61 - 1.24 mg/dL 0.95  0.91    Sodium 135 - 145 mmol/L 137  138    Potassium 3.5 - 5.1 mmol/L 4.1  4.3    Chloride 98 - 111 mmol/L 105  108    CO2 22 - 32 mmol/L 25  25    Calcium 8.9 - 10.3 mg/dL 8.7  8.7    Total Protein 6.5 - 8.1 g/dL 6.4  6.8  7.3   Total Bilirubin 0.3 - 1.2 mg/dL 9.2  1.3  1.8   Alkaline Phos 38 - 126 U/L 705  461  490   AST 15 - 41 U/L 125  68  62   ALT 0 - 44 U/L 112  53  55       RADIOGRAPHIC STUDIES: I have personally reviewed the radiological images as listed and agreed with the findings in the report. No results found.   ASSESSMENT & PLAN: Zalan Shidler is a 81 y.o. male with    Symptom management: New obstructive jaundice, ascites, progressive weakness -MRI abdomen 09/02/2022 shows intrahepatic biliary ductal dilatation throughout  the right lobe due to obstruction in the central liver  hilum -T bili 1.3 on 08/23/22, now 9.2 (09/14/22) with worsening LFTs -secondary to liver metastasis and extrinsic tumor compression, will see if he is a candidate for stenting. I spoke with IR team -Therapeutic para 09/15/2022 in IR -will meet with palliative care at [facility] 10/18, then community home palliative care 10/19 -I encouraged patient to consider his goals of care -continue nutrition supplements -close f/up   2. 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 Colonoscopy on 08/16/22 by Dr. Candis Schatz showed partially obstructing tumor in proximal rectum; path confirmed invasive moderately differentiated adenocarcinoma.  -CT AP without contrast 07/02/22 showed only new left hepatic cirrhosis, no rectal mass, adenopathy or evidence of distant metastasis.  However the scan was done without contrast, which limits the evaluation. -elevated tumor markers-- baseline AFP 08/13/22 at 111.8, repeat CEA on 08/23/22 at 10.77. -staging chest CT 09/02/22 showed: mildly enlarged left internal mammary lymph node; three small pulmonary nodules in lung bases, up to 0.5 cm. Same day abdomen MRI showed: diffuse hepatic involvement by numerous masses, largest nearly replacing left hepatic lobe; porta hepatis lymphadenopathy; moderate ascites. Staged by pelvis MRI as T3b, N1, located 8.9 cm from anal sphincter. -Unfortunately due to stage IV disease with extensive liver metastases, he is not eligible for surgery and his disease is not curable.  He was referred to colorectal surgeon Dr. Johney Maine prior to confirmation of stage IV disease and was seen yesterday.  -Due to his poor performance status and advanced age he is not eligible for intensive intravenous chemotherapy. Dr. Burr Medico previously discussed possibly trying single agent xeloda after  radiation, he is interested if eligible. -He is symptomatic from rectal tumor and liver metastasis, he met radiation today who plans palliative radiation to left liver mets and rectum in 5 fractions starting next week.  Planning CT this Friday. -Mr. Ennen appears weak but stable for outpatient management.  His performance status has further decreased this week, he has developed jaundice and ascites.  He is scheduled for therapeutic paracentesis tomorrow, Dr. Kathlene Cote has approved this -LFTs have worsened in the past 3 weeks, now AST 125, ALT 112, alk phos 705 with T. bili 9.2 (1.3 on 08/23/22) -I am concerned about his rapid decline.  He will meet with palliative care NP [facility] tomorrow before para, then community palliative care at home on Thursday 10/19. -I have discussed his case with GI Dr. Candis Schatz and IR Dr. Kathlene Cote.  There was biliary ductal dilatation on his MRI from 10/5 from extrinsic tumor compression.  We will take a look at the liver tomorrow to see if he is a candidate for stenting.  -If the liver dysfunction is not reversible, he is likely not a candidate for Xeloda in the future.  We discussed his goals of care, he would still like to try all options for which he is eligible.    3.  Symptom Management: abdominal pain, low appetite, fatigue -He tried tramadol and norco, threw it up.  -Denies pain today; I recommend to use only as needed due to liver dysfunction  -Marinol is helping, refilled   4. Newly diagnosed liver cirrhosis, heavy alcohol drinking history -liver cirrhosis found on initial CT on 07/02/22 -Follow-up with GI Dr. Candis Schatz  PLAN: -Labs reviewed -Palliative care consult 10/18 -ABD [place] with paracentesis 10/18 -Meet community palliative care at home 10/19 -CT sim 10/20 for palliative RT to left liver and rectum x5 fractions,  starting next week -Discussed case with Dr. Candis Schatz, Dr. Kathlene Cote and IR, and rad onc team; will see if he is a candidate for stenting to  improve liver function -close f/up next week   All questions were answered. The patient knows to call the clinic with any problems, questions or concerns. No barriers to learning was detected. I spent 25 minutes counseling the patient face to face. The total time spent in the appointment was 40 minutes and more than 50% was on counseling and review of test results and coordination of care.      Alla Feeling, NP 09/14/22

## 2022-09-14 NOTE — Telephone Encounter (Signed)
CRITICAL VALUE STICKER  CRITICAL VALUE: Total Bilrubin  RECEIVER (on-site recipient of call): Kim RN  DATE & TIME NOTIFIED: 09/14/2022 @ 11:12 am  MESSENGER (representative from lab): Rosann Auerbach  MD NOTIFIED: Dr Ernestina Penna nurse as Dr Burr Medico is out.   TIME OF NOTIFICATION: 11:15 pm  RESPONSE: Tammi Sou will direct to POD partner to handle

## 2022-09-14 NOTE — Telephone Encounter (Signed)
Lab called with critical lab value: Tbili 9.2  Cira Rue, NP notified verbally and by in basket message.

## 2022-09-14 NOTE — Progress Notes (Signed)
Radiation Oncology         [phone] ________________________________  Name: [name]        MRN: [id number]  Date of Service: 09/14/2022 DOB: [birth year]  WP:[id number], Malka So, MD  Truitt Merle, MD     REFERRING PHYSICIAN: Truitt Merle, MD   DIAGNOSIS: The encounter diagnosis was Rectal cancer [facility]).   HISTORY OF PRESENT ILLNESS: [name] is a 81 y.o. male seen at the request of Dr. Burr Medico with Stage IV rectal carcinoma. The patient recently presented with complaints of frequent bowel movements hematochezia and abdominal pain and CT imaging on 07/02/2022 showed left hepatic cirrhotic change and no other focal findings in the GI tract. He proceeded with colonoscopy on 08/16/2022 with Dr. Candis Schatz and this revealed a partially obstructing large mass in the proximal rectum 10 cm from the anal verge approximately 5 cm in length.  Biopsies confirmed adenocarcinoma.  A CT chest without contrast on 09/02/2022 showed a mildly enlarged left internal mammary lymph nodes 3 small solid pulmonary nodules in the lung bases the largest 5 mm and extensive confluent hypodense liver masses throughout the liver.  A small volume of ascites was noted.  MRI of the abdomen and pelvis were performed on 09/02/2022 and diffuse hepatic involvement with numerous metastases in the liver were noted with the large nearly replacing left hepatic lobe mass with diffuse necrosis.  Moderate ascites was noted and intrahepatic biliary ductal dilatation throughout the right hepatic lobe due to obstruction in the central liver hilum was noted.  In the pelvis he had T3BN1 disease and for these reasons has met with Dr. Burr Medico to discuss palliative Xeloda.  He is seen to discuss palliative radiation as well.  He is not felt to be a good surgical candidate even for diverting ostomy.   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, 07/25/2008 treated with 2 overlapping DES   Depression    Elevated cholesterol    Gallstones    Glaucoma    Hyperlipidemia    with low high-density lipoprotein   Irritable bowel syndrome    Kidney stone    Kidney stones        PAST SURGICAL HISTORY: Past Surgical History:  Procedure Laterality Date   BACK SURGERY     CHOLECYSTECTOMY     CORONARY STENT INTERVENTION N/A 02/20/2021   Procedure: CORONARY STENT INTERVENTION;  Surgeon: Leonie Man, MD;  Location: [facility];  Service: Cardiovascular;  Laterality: N/A;   CORONARY STENT PLACEMENT     2 overlapping Promus DES (covering existing BMS) in RCA: Promus DES 3.5x23 (x 2) -> post-dilated to 3.75  mm   LEFT HEART CATH AND CORONARY ANGIOGRAPHY N/A 02/20/2021   Procedure: LEFT HEART CATH AND CORONARY ANGIOGRAPHY;  Surgeon: Leonie Man, MD;  Location: [facility];  Service: Cardiovascular;  Laterality: N/A;     FAMILY HISTORY:  Family History  Problem Relation Age of Onset   Colon cancer Neg Hx    Esophageal cancer Neg Hx    Stomach cancer Neg Hx    Rectal cancer Neg Hx      SOCIAL HISTORY:  reports that he has never smoked. He has never used  smokeless tobacco. He reports that he does not currently use alcohol. He reports that he does not use drugs. The patient is married and lives in [place]. He is accompanied by his daughter and wife.   ALLERGIES: Gadolinium derivatives, Multihance [gadobenate], and Iodine   MEDICATIONS:  Current Outpatient Medications  Medication Sig Dispense Refill   aspirin 81 MG tablet Take 81 mg by mouth daily.     capecitabine (XELODA) 500 MG tablet Take 3 tablets by mouth every 12 hours. Take within 30 minutes after a meal. Take for 14 days, then off for 7 days. Repeat every 21 days. 84 tablet 0   diphenhydrAMINE  (BENADRYL) 50 MG tablet Take 1 hour before ct scan 30 tablet 0   dorzolamide (TRUSOPT) 2 % ophthalmic solution 1 drop 3 (three) times daily.     dorzolamide-timolol (COSOPT) 22.3-6.8 MG/ML ophthalmic solution Place 1 drop into both eyes daily.     dronabinol (MARINOL) 2.5 MG capsule Take 1 capsule (2.5 mg total) by mouth 2 (two) times daily before a meal. 20 capsule 0   gabapentin (NEURONTIN) 300 MG capsule Take 1 capsule (300 mg total) by mouth 2 (two) times daily. 60 capsule 3   HYDROcodone-acetaminophen (NORCO/VICODIN) 5-325 MG tablet Take 1 tablet by mouth at bedtime as needed for moderate pain. 30 tablet 0   loperamide (IMODIUM A-D) 2 MG tablet Take 1 tablet (2 mg total) by mouth 3 (three) times daily as needed for diarrhea or loose stools. 60 tablet 3   LORazepam (ATIVAN) 0.5 MG tablet Take 1 tablet (0.5 mg total) by mouth as needed for sleep. 30 tablet 3   LUMIGAN 0.01 % SOLN Place 1 drop into both eyes daily.     omeprazole (PRILOSEC) 40 MG capsule Take 1 capsule (40 mg total) by mouth daily. 30 capsule 1   predniSONE (DELTASONE) 50 MG tablet Take 1 tab 13 hours before the CT scan Take 1 tab 7 hours before the CT scan Take 1 tab 1 hour before the CT scan 3 tablet 0   sertraline (ZOLOFT) 50 MG tablet Take 50 mg by mouth daily.     traMADol (ULTRAM) 50 MG tablet Take 1 tablet (50 mg total) by mouth every 6 (six) hours as needed. 20 tablet 0   nitroGLYCERIN (NITROSTAT) 0.4 MG SL tablet Place 1 tablet (0.4 mg total) under the tongue every 5 (five) minutes x 3 doses as needed for chest pain. (Patient not taking: Reported on 08/13/2022) 25 tablet 2   No current facility-administered medications for this encounter.     REVIEW OF SYSTEMS: On review of systems, the patient reports that he is doing okay overall. He's had some days where he has a fairly good appetite and other days where he is so full and has early satiety he cannot eat much. He has had yellowing of his skin gradually but his family  doesn't seem to have noticed this until it has been pointed out by providers. He reports pelvic pain, and fullness, 5 episodes of diarrhea per day despite imodium, and dark tarry stool. He reports dark urine with a strong odor. He denies any dysuria. He denies nausea, or vomiting, and reports occasional left sided abdominal pain, and feelings of fluid sloshing around at night when he tries to lay flat and get comfortable. He reports cough after eating or drinking. No other complaints are verbalized.      PHYSICAL EXAM:  Wt Readings from Last 3 Encounters:  09/14/22 158 lb 6 oz (  71.8 kg)  09/07/22 156 lb 4.8 oz (70.9 kg)  08/27/22 159 lb 4 oz (72.2 kg)   Temp Readings from Last 3 Encounters:  09/14/22 97.8 F (36.6 C) (Oral)  09/07/22 98.2 F (36.8 C) (Oral)  08/23/22 98.1 F (36.7 C) (Oral)   BP Readings from Last 3 Encounters:  09/14/22 109/69  09/07/22 123/80  08/27/22 127/88   Pulse Readings from Last 3 Encounters:  09/14/22 72  09/07/22 60  08/27/22 90   Pain Assessment Pain Score: 0-No pain/10  In general this is a jaundiced, tired, chronically ill appearing caucasian male. in no acute distress. he's alert and oriented x4 and appropriate throughout the examination. Cardiopulmonary assessment is negative for acute distress and he exhibits normal effort. He has a large distended abdomen that is protuberant and fluid wave is suspected on palpation.     ECOG = 2  0 - Asymptomatic (Fully active, able to carry on all predisease activities without restriction)  1 - Symptomatic but completely ambulatory (Restricted in physically strenuous activity but ambulatory and able to carry out work of a light or sedentary nature. For example, light housework, office work)  2 - Symptomatic, <50% in bed during the day (Ambulatory and capable of all self care but unable to carry out any work activities. Up and about more than 50% of waking hours)  3 - Symptomatic, >50% in bed, but not  bedbound (Capable of only limited self-care, confined to bed or chair 50% or more of waking hours)  4 - Bedbound (Completely disabled. Cannot carry on any self-care. Totally confined to bed or chair)  5 - Death   Eustace Pen MM, Creech RH, Tormey DC, et al. [id number]). "Toxicity and response criteria of the [facility]". [facility] Oncol. 5 (6): 649-55    LABORATORY DATA:  Lab Results  Component Value Date   WBC 5.4 08/23/2022   HGB 13.0 08/23/2022   HCT 38.7 (L) 08/23/2022   MCV 94.9 08/23/2022   PLT 173 08/23/2022   Lab Results  Component Value Date   NA 138 08/23/2022   K 4.3 08/23/2022   CL 108 08/23/2022   CO2 25 08/23/2022   Lab Results  Component Value Date   ALT 53 (H) 08/23/2022   AST 68 (H) 08/23/2022   ALKPHOS 461 (H) 08/23/2022   BILITOT 1.3 (H) 08/23/2022      RADIOGRAPHY: MR PELVIS WO CM RECTAL CA STAGING  Result Date: 09/06/2022 CLINICAL DATA:  Rectal cancer.  Staging.  Hematochezia for 6 months. EXAM: MRI PELVIS WITHOUT CONTRAST TECHNIQUE: Multiplanar multisequence MR imaging of the pelvis was performed. No intravenous contrast was administered. Ultrasound gel was administered per rectum to optimize tumor evaluation. COMPARISON:  Abdominopelvic CT 07/02/2022 and 06/11/2020. FINDINGS: TUMOR LOCATION Tumor distance from Anal Verge/Skin Surface: 12.3 cm on 20/2. Tumor distance to Internal Anal Sphincter: 8.9 cm on 26/4 TUMOR DESCRIPTION Circumferential Extent: Entirely circumferential, including on 31/5 Tumor Length: 4.1 cm in 20/2. T - CATEGORY Extension through Muscularis Propria: Maximally 5 mm at the 2 o'clock position on 36/5. T3b Shortest Distance of any tumor/node from Mesorectal Fascia: 5 mm on 36/5 Extramural Vascular Invasion/Tumor Thrombus: No Invasion of Anterior Peritoneal Reflection: No Involvement of Adjacent Organs or Pelvic Sidewall: No Levator Ani Involvement: No N - CATEGORY Mesorectal Lymph Nodes >=76m: An isolated 7 mm node within  the high mesorectum/inferior sigmoid mesocolon on 06/12. N1 Extra-mesorectal Lymphadenopathy: No Other: Small volume free pelvic fluid, new since 07/02/2022. No bowel obstruction.  Moderate prostatomegaly. Normal urinary bladder. Tiny fat containing right inguinal hernia. IMPRESSION: Rectal adenocarcinoma T stage: T3 B Rectal adenocarcinoma N stage:  N1 Distance from tumor to the internal anal sphincter is 8.9 cm Electronically Signed   By: Abigail Miyamoto M.D.   On: 09/06/2022 13:38   MR Abdomen W Wo Contrast  Result Date: 09/04/2022 CLINICAL DATA:  Cirrhosis.  Elevated AFP.  Rectal carcinoma. EXAM: MRI ABDOMEN WITHOUT AND WITH CONTRAST TECHNIQUE: Multiplanar multisequence MR imaging of the abdomen was performed both before and after the administration of intravenous contrast. CONTRAST:  7 mL Vueway COMPARISON:  Noncontrast CT on 07/02/2022 FINDINGS: Lower chest: No acute findings. Hepatobiliary: A large mass is seen involving the majority of the left hepatic lobe which is largely necrotic measuring approximately 16.3 x 7.9 cm. Elsewhere throughout the liver are innumerable small peripherally enhancing masses, which nearly all measure less than 2 cm in size. Enhancement pattern favors diffuse metastatic disease, however diffuse multifocal hepatocellular carcinoma cannot be excluded. Intrahepatic biliary ductal dilatation is seen throughout the right hepatic lobe due to obstruction in the central liver hilum. Gallbladder is not visualized and there is no evidence of dilatation of the common bile duct. Pancreas: No mass or inflammatory changes. No evidence of pancreatic ductal dilatation. Spleen:  Within normal limits in size and appearance. Adrenals/Urinary Tract: No suspicious masses identified. No evidence of hydronephrosis. Stomach/Bowel: Unremarkable. Vascular/Lymphatic: Lymphadenopathy with central necrosis is seen in the porta hepatis, largest measuring 2.5 cm short axis on image 16/16. No acute vascular  findings. Other:  Moderate ascites noted. Musculoskeletal:  No suspicious bone lesions identified. IMPRESSION: Diffuse hepatic involvement by numerous masses, largest nearly replacing the left hepatic lobe and showing diffuse necrosis. Characteristics favor diffuse hepatic metastatic disease, however diffuse multifocal hepatocellular carcinoma cannot be excluded. Tissue sampling should be considered. Porta hepatis lymphadenopathy, consistent with metastatic disease. Moderate ascites. Intrahepatic biliary ductal dilatation throughout the right hepatic lobe due to obstruction in the central liver hilum. Electronically Signed   By: Marlaine Hind M.D.   On: 09/04/2022 09:45   CT Chest Wo Contrast  Result Date: 09/03/2022 CLINICAL DATA:  Rectal cancer staging. * Tracking Code: BO * EXAM: CT CHEST WITHOUT CONTRAST TECHNIQUE: Multidetector CT imaging of the chest was performed following the standard protocol without IV contrast. RADIATION DOSE REDUCTION: This exam was performed according to the departmental dose-optimization program which includes automated exposure control, adjustment of the mA and/or kV according to patient size and/or use of iterative reconstruction technique. COMPARISON:  None Available. FINDINGS: Cardiovascular: Normal heart size. No significant pericardial effusion/thickening. Three-vessel coronary atherosclerosis. Atherosclerotic nonaneurysmal thoracic aorta. Normal caliber pulmonary arteries. Mediastinum/Nodes: No significant thyroid nodules. Small fluid level in the midportion of the mildly patulous thoracic esophagus. No axillary adenopathy. Mildly enlarged 1.0 cm short axis diameter left internal mammary lymph node (series 2/image 65). Lungs/Pleura: No pneumothorax. No pleural effusion. No acute consolidative airspace disease or lung masses. Three small solid pulmonary nodules scattered at the lung bases bilaterally, largest 0.5 cm in the posterior left lower lobe (series 5/image 98). Upper  abdomen: Extensive confluent hypodense liver masses throughout the visualized liver, most prominent in the left liver lobe, poorly delineated on this noncontrast CT. Cholecystectomy. Small volume ascites. Musculoskeletal: No aggressive appearing focal osseous lesions. Moderate thoracic spondylosis. IMPRESSION: 1. Mildly enlarged left internal mammary lymph node, suspicious for nodal metastasis. 2. Three small solid pulmonary nodules scattered at the lung bases bilaterally, largest 0.5 cm, cannot exclude pulmonary metastases. Recommend attention on follow-up  chest CT in 3 months. 3. Extensive confluent hypodense liver masses throughout the visualized liver, poorly delineated on this noncontrast CT, suspicious for liver metastases, please see the separate same-day MRI abdomen report for details. 4. Small volume ascites. 5. Three-vessel coronary atherosclerosis. 6.  Aortic Atherosclerosis (ICD10-I70.0). Electronically Signed   By: Ilona Sorrel M.D.   On: 09/03/2022 17:44       IMPRESSION/PLAN: 1. 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 Dr. Lisbeth Renshaw discusses the pathology findings and reviews the nature of metastatic disease. He appears to have ascites on exam and we discussed his case with IR. Dr. Kathlene Cote has approved ultrasound and possible paracentesis which is scheduled tomorrow. Dr. Lisbeth Renshaw discussed the rationale to consider palliative radiotherapy versus no treatment and enrolling in hospice care. He is motivated to try palliative radiation.  We discussed the risks, benefits, short, and long term effects of radiotherapy, as well as the palliative intent, and the patient is interested in proceeding. Dr. Lisbeth Renshaw discusses the delivery and logistics of radiotherapy and anticipates a course of 5 fractions of radiotherapy to the rectum and left liver tumor. Written consent is obtained and placed in the chart, a copy was provided to the patient. He will simulate  Friday and we plan to start  treatment early next week. He will follow up with Dr. Burr Medico to determine next steps of options for palliative chemotherapy as well.      In a visit lasting 60 minutes, greater than 50% of the time was spent face to face discussing the patient's condition, in preparation for the discussion, and coordinating the patient's care.   The above documentation reflects my direct findings during this shared patient visit. Please see the separate note by Dr. Lisbeth Renshaw on this date for the remainder of the patient's plan of care.    Carola Rhine, [facility]   **Disclaimer: This note was dictated with voice recognition software. Similar sounding words can inadvertently be transcribed and this note may contain transcription errors which may not have been corrected upon publication of note.**

## 2022-09-14 NOTE — Addendum Note (Signed)
Addended by: Alla Feeling on: 09/14/2022 05:11 PM   Modules accepted: Orders

## 2022-09-15 ENCOUNTER — Other Ambulatory Visit: Payer: Self-pay | Admitting: Radiation Oncology

## 2022-09-15 ENCOUNTER — Ambulatory Visit (HOSPITAL_COMMUNITY)
Admission: RE | Admit: 2022-09-15 | Discharge: 2022-09-15 | Disposition: A | Discharge status: Home / Self Care | Payer: Medicare HMO | Source: Ambulatory Visit | Attending: Radiation Oncology | Admitting: Radiation Oncology

## 2022-09-15 ENCOUNTER — Ambulatory Visit (HOSPITAL_COMMUNITY)
Admission: RE | Admit: 2022-09-15 | Discharge: 2022-09-15 | Disposition: A | Discharge status: Home / Self Care | Payer: Medicare HMO | Source: Ambulatory Visit | Attending: Nurse Practitioner | Admitting: Nurse Practitioner

## 2022-09-15 ENCOUNTER — Inpatient Hospital Stay: Payer: Medicare HMO | Admitting: Nurse Practitioner

## 2022-09-15 ENCOUNTER — Other Ambulatory Visit (HOSPITAL_COMMUNITY): Payer: Self-pay

## 2022-09-15 ENCOUNTER — Ambulatory Visit: Payer: Medicare HMO | Admitting: Radiation Oncology

## 2022-09-15 DIAGNOSIS — C20 Malignant neoplasm of rectum: Secondary | ICD-10-CM

## 2022-09-15 IMAGING — DX DG CHEST 2V
2 series · 2 of 2 positions shown · non-contrast
Comparison: None.

CLINICAL DATA: Chest pain

EXAM:
CHEST - 2 VIEW

[chest pa]
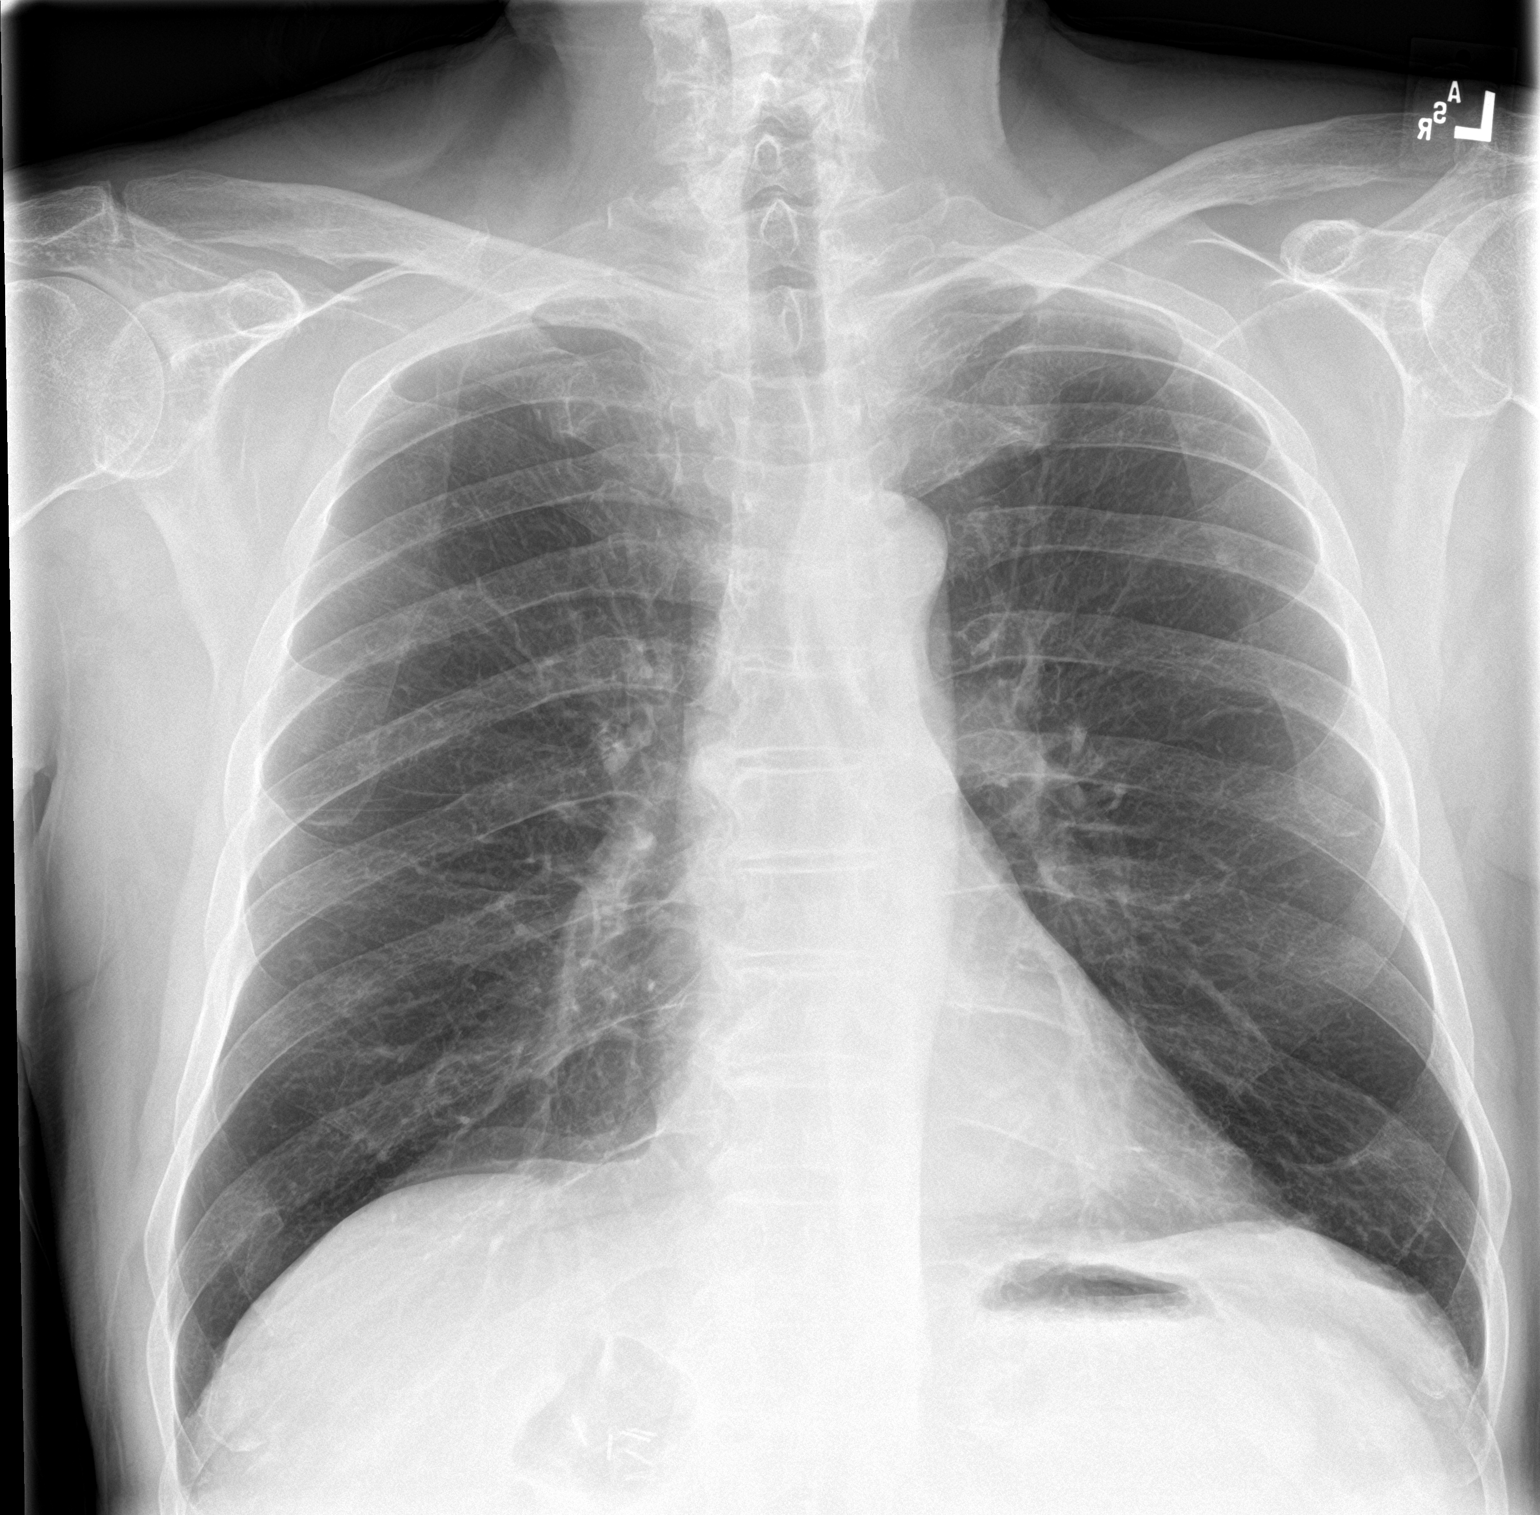

[chest lat]
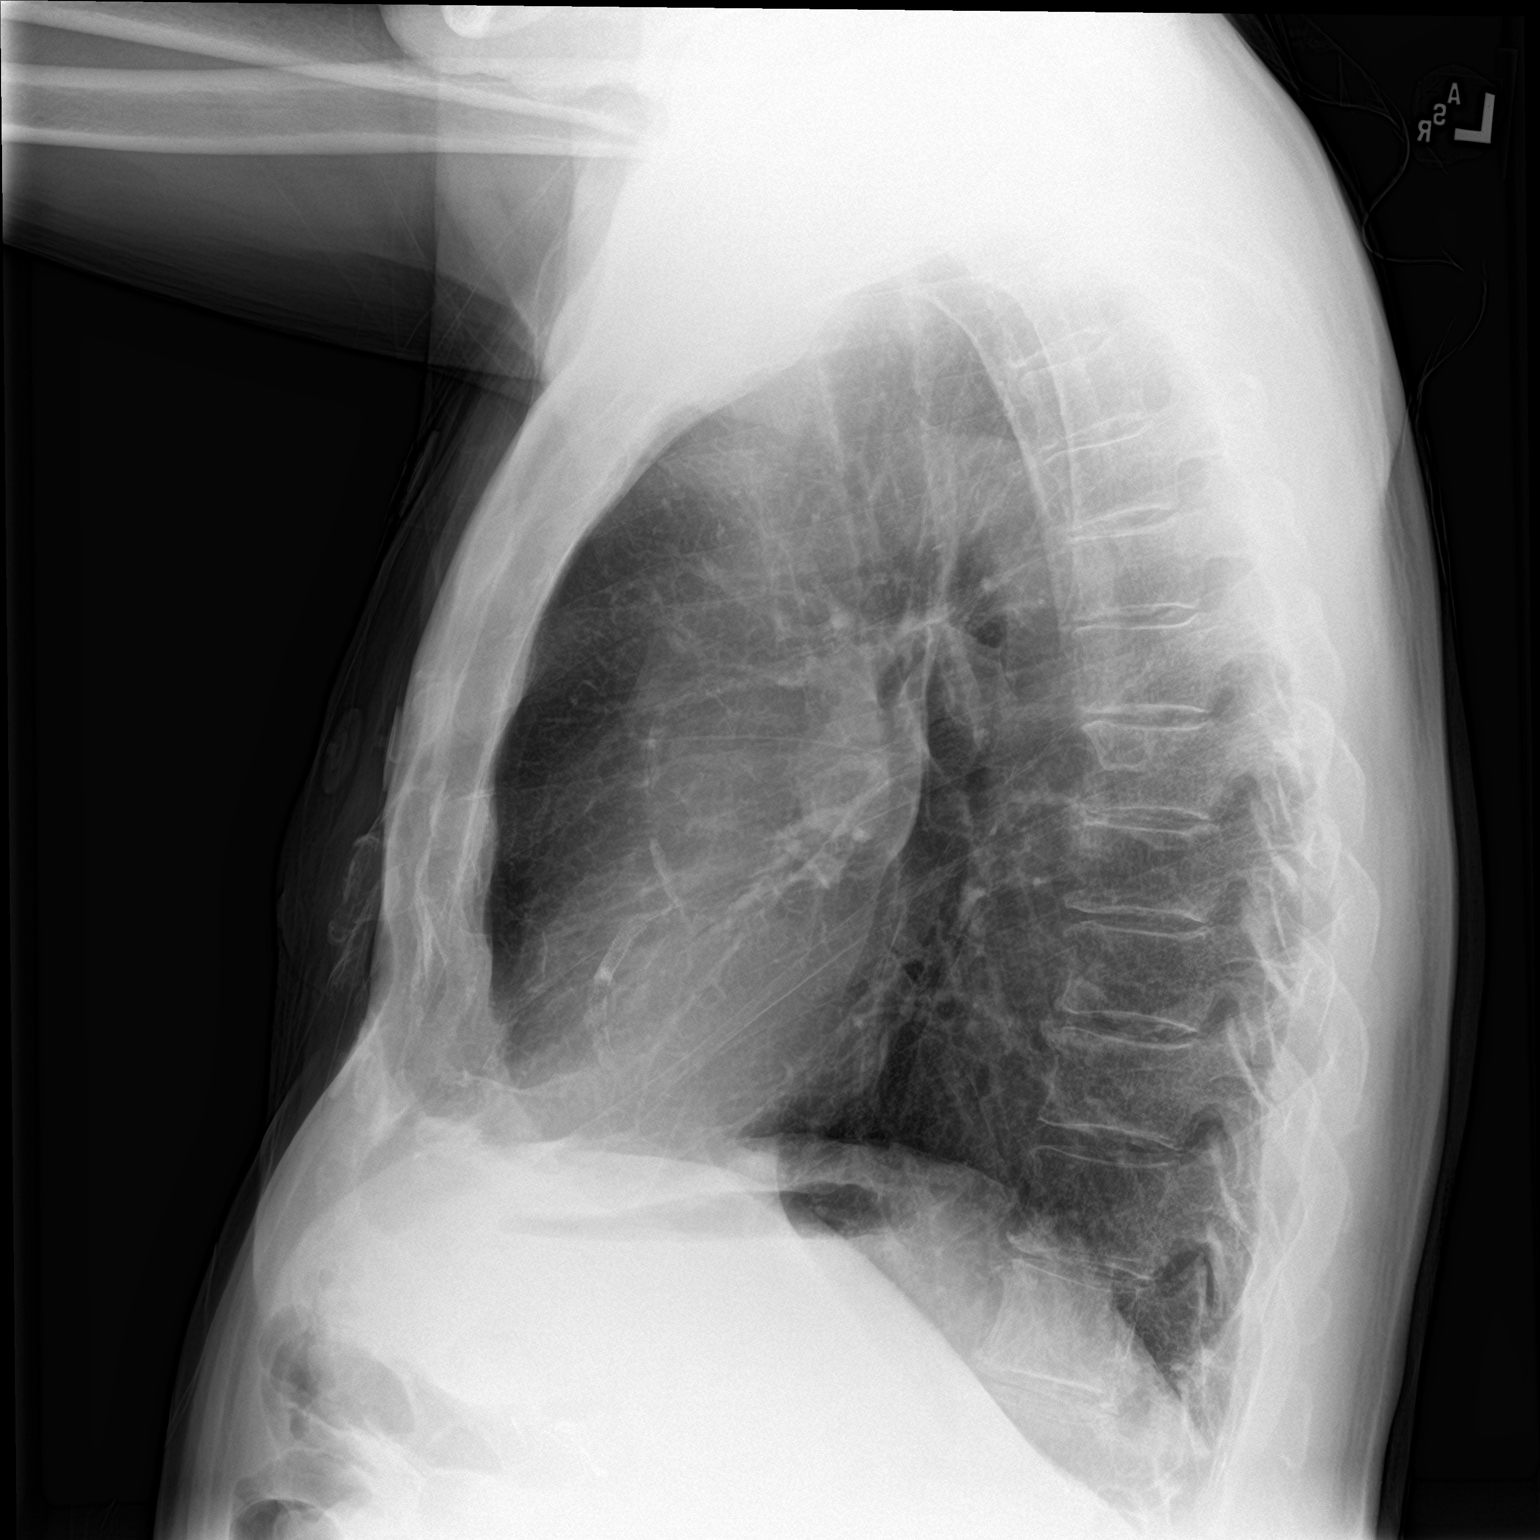

[2 of 2 positions shown; findings below may reference images not displayed]

FINDINGS: The heart size and mediastinal contours are within normal limits.
Both lungs are clear. The visualized skeletal structures are
unremarkable.
IMPRESSION: No active cardiopulmonary disease.

## 2022-09-15 MED ORDER — LIDOCAINE HCL 1 % IJ SOLN
INTRAMUSCULAR | Status: AC
  Filled 2022-09-15: qty 20

## 2022-09-15 NOTE — Telephone Encounter (Signed)
Oral Chemotherapy Pharmacist Encounter   Spoke with patient's daughter, Ceasar Mons, regarding Xeloda (capecitabine). Medication was picked up from the [facility] on 09/14/22. Discussed with Ms. Talbert that patient is not starting this until instructed by Dr. Audie Box. Ms. Earma Reading has the Xeloda stored in a safe place until further discussions about Xeloda occur with MD office.   Oral oncology clinic will continue to follow.   Leron Croak, PharmD, BCPS, [facility] Hematology/Oncology Clinical Pharmacist [facility] and [place] [phone] 09/15/2022 8:52 AM

## 2022-09-16 ENCOUNTER — Other Ambulatory Visit: Payer: Self-pay

## 2022-09-16 ENCOUNTER — Telehealth: Payer: Self-pay

## 2022-09-16 ENCOUNTER — Inpatient Hospital Stay: Payer: Medicare HMO | Admitting: Licensed Clinical Social Worker

## 2022-09-16 ENCOUNTER — Other Ambulatory Visit: Payer: Self-pay | Admitting: Interventional Radiology

## 2022-09-16 DIAGNOSIS — C20 Malignant neoplasm of rectum: Secondary | ICD-10-CM

## 2022-09-16 DIAGNOSIS — K838 Other specified diseases of biliary tract: Secondary | ICD-10-CM

## 2022-09-16 MED ORDER — LOPERAMIDE HCL 2 MG PO TABS: 2.0000 mg | ORAL_TABLET | Freq: Three times a day (TID) | ORAL | 3 refills | Status: AC | PRN

## 2022-09-16 MED ORDER — ONDANSETRON 8 MG PO TBDP: 8.0000 mg | ORAL_TABLET | Freq: Three times a day (TID) | ORAL | 0 refills | Status: AC | PRN

## 2022-09-16 NOTE — Progress Notes (Signed)
[facility] CSW Progress Note  Holiday representative  spoke with pt's daughter regarding concerns about pt's rapid physical decline and next steps.    CSW provided emotional support and discussed at length palliative services and eventual transition to hospice when appropriate.  CSW provided daughter with direct contact information to ensure additional support can easily be provided as appropriate.      Henriette Combs, LCSW
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
## 2022-09-17 ENCOUNTER — Telehealth: Payer: Self-pay | Admitting: Family Medicine

## 2022-09-17 ENCOUNTER — Other Ambulatory Visit: Payer: Self-pay | Admitting: Hematology

## 2022-09-17 ENCOUNTER — Ambulatory Visit
Admission: RE | Admit: 2022-09-17 | Discharge: 2022-09-17 | Disposition: A | Discharge status: Home / Self Care | Payer: Medicare HMO | Source: Ambulatory Visit | Attending: Interventional Radiology | Admitting: Interventional Radiology

## 2022-09-17 ENCOUNTER — Ambulatory Visit
Admission: RE | Admit: 2022-09-17 | Discharge: 2022-09-17 | Disposition: A | Discharge status: Home / Self Care | Payer: Medicare HMO | Source: Ambulatory Visit | Attending: Radiation Oncology | Admitting: Radiation Oncology

## 2022-09-17 DIAGNOSIS — C787 Secondary malignant neoplasm of liver and intrahepatic bile duct: Secondary | ICD-10-CM | POA: Insufficient documentation

## 2022-09-17 DIAGNOSIS — C20 Malignant neoplasm of rectum: Secondary | ICD-10-CM | POA: Diagnosis present

## 2022-09-17 DIAGNOSIS — Z510 Encounter for antineoplastic radiation therapy: Secondary | ICD-10-CM | POA: Diagnosis present

## 2022-09-17 DIAGNOSIS — K838 Other specified diseases of biliary tract: Secondary | ICD-10-CM

## 2022-09-17 NOTE — Telephone Encounter (Signed)
Requesting refill of dronabinol (MARINOL) 2.5 MG capsule advised this PCP was not the prescriber.  [id number] [place], [place] [facility] [address]:  [phone] Fax:  [phone]

## 2022-09-17 NOTE — Progress Notes (Signed)
09/17/2022  [name] DOB: May 04, 1941 MRN: [id number]   RIDER WAIVER AND RELEASE OF LIABILITY  For purposes of improving physical access to our facilities, [facility] partners with third-party transportation providers to provide [facility] patients or other authorized individuals the option of convenient, on-demand ground transportation [place]") for non-emergent medical transportation.  By opting to use and accept these [employer], I, the undersigned, hereby agree on behalf of myself, and on behalf of any minor child using the Government social research officer for whom I am the parent or legal guardian, as follows:  Government social research officer provided to me are provided by independent third-party transportation providers who are not [employer] or employees and who are unaffiliated with [employer]. [place] is neither a transportation carrier nor a common or public carrier. [facility] has no control over the quality or safety of the transportation that occurs as a result of the [employer]. [facility] cannot guarantee that any third-party transportation provider will complete any arranged transportation service. [place] [facility] makes no representation, warranty, or guarantee regarding the reliability, timeliness, quality, safety, suitability, or availability of any of the Transport Services or that they will be error free. I fully understand that traveling by vehicle involves risks and dangers of serious bodily injury, including permanent disability, paralysis, and death. I agree, on behalf of myself and on behalf of any minor child using the Transport Services for whom I am the parent or legal guardian, that the entire risk arising out of my use of the [employer] remains solely with me, to the maximum extent permitted under applicable law. The [employer] are provided "as is" and "as available." [facility] disclaims all representations and  warranties, express, implied or statutory, not expressly set out in these terms, including the implied warranties of merchantability and fitness for a particular purpose. I hereby waive and release [facility], its agents, employees, officers, directors, representatives, insurers, attorneys, assigns, successors, subsidiaries, and affiliates from any and all past, present, or future claims, demands, liabilities, actions, causes of action, or [place] of any kind directly or indirectly arising from acceptance and use of the [employer]. I further waive and release [facility] and its affiliates from all present and future liability and responsibility for any injury or death to persons or damages to property caused by or related to the use of the [employer]. I have read this Waiver and Release of Liability, and I understand the terms used in it and their legal significance. This Waiver is freely and voluntarily given with the understanding that my right (as well as the right of any minor child for whom I am the parent or legal guardian using the [employer]) to legal recourse against  in connection with the [employer] is knowingly surrendered in return for use of these services.   I attest that I read the Ride Waiver and Release of Liability to [facility], gave Mr. Coberly the opportunity to ask questions and answered the questions asked (if any). I affirm that [place] then provided consent for assistance with transportation.

## 2022-09-19 DIAGNOSIS — Z510 Encounter for antineoplastic radiation therapy: Secondary | ICD-10-CM | POA: Diagnosis not present

## 2022-09-20 ENCOUNTER — Other Ambulatory Visit: Payer: Self-pay | Admitting: Gastroenterology

## 2022-09-20 ENCOUNTER — Ambulatory Visit
Admission: RE | Admit: 2022-09-20 | Discharge: 2022-09-20 | Disposition: A | Discharge status: Home / Self Care | Payer: Medicare HMO | Source: Ambulatory Visit | Attending: Radiation Oncology | Admitting: Radiation Oncology

## 2022-09-20 ENCOUNTER — Ambulatory Visit (HOSPITAL_COMMUNITY)
Admission: RE | Admit: 2022-09-20 | Discharge: 2022-09-20 | Disposition: A | Discharge status: Home / Self Care | Payer: Medicare HMO | Source: Ambulatory Visit | Attending: Gastroenterology | Admitting: Gastroenterology

## 2022-09-20 ENCOUNTER — Other Ambulatory Visit: Payer: Self-pay

## 2022-09-20 ENCOUNTER — Inpatient Hospital Stay: Payer: Medicare HMO

## 2022-09-20 ENCOUNTER — Telehealth: Payer: Self-pay | Admitting: Family Medicine

## 2022-09-20 ENCOUNTER — Other Ambulatory Visit: Payer: Self-pay | Admitting: Student

## 2022-09-20 ENCOUNTER — Other Ambulatory Visit (HOSPITAL_COMMUNITY): Payer: Self-pay | Admitting: Interventional Radiology

## 2022-09-20 DIAGNOSIS — R748 Abnormal levels of other serum enzymes: Secondary | ICD-10-CM

## 2022-09-20 DIAGNOSIS — R197 Diarrhea, unspecified: Secondary | ICD-10-CM

## 2022-09-20 DIAGNOSIS — R634 Abnormal weight loss: Secondary | ICD-10-CM

## 2022-09-20 DIAGNOSIS — R1012 Left upper quadrant pain: Secondary | ICD-10-CM | POA: Diagnosis not present

## 2022-09-20 HISTORY — PX: IR RADIOLOGIST EVAL & MGMT: IMG5224

## 2022-09-20 LAB — RAD ONC ARIA SESSION SUMMARY
Course Elapsed Days: 0
Plan Fractions Treated to Date: 1
Plan Fractions Treated to Date: 1
Plan Prescribed Dose Per Fraction: 4 Gy
Plan Prescribed Dose Per Fraction: 5 Gy
Plan Total Fractions Prescribed: 5
Plan Total Fractions Prescribed: 5
Plan Total Prescribed Dose: 20 Gy
Plan Total Prescribed Dose: 25 Gy
Reference Point Dosage Given to Date: 4 Gy
Reference Point Dosage Given to Date: 5 Gy
Reference Point Session Dosage Given: 4 Gy
Reference Point Session Dosage Given: 5 Gy
Session Number: 1

## 2022-09-20 NOTE — Consult Note (Signed)
Chief Complaint: Malignant biliary obstruction  Referring Physician(s): Truitt Merle, MD  History of Present Illness: [name] is a 81 y.o. 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  The conglomerate left hepatic masses are now resulting in biliary obstruction with resultant hyperbilirubinemia and jaundice.  IR was consulted for possible biliary drain placement versus stent.  He presents today on behalf of his daughter, Ceasar Mons, via virtual telephone clinic visit.  She states that he has been declining rapidly, but was in good spirits yesterday.  He is planning to undergo radiation therapy this week, and has an appointment later in the week to discuss possible chemotherapy with Dr. Burr Medico.  She confirms that most of his care is now directed toward a palliative approach.    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 implant, 07/25/2008 treated with 2 overlapping DES   Depression    Elevated cholesterol    Gallstones    Glaucoma    Hyperlipidemia    with low high-density lipoprotein   Irritable bowel syndrome    Kidney stone    Kidney stones     Past Surgical History:  Procedure Laterality Date   BACK SURGERY     CHOLECYSTECTOMY     CORONARY STENT INTERVENTION N/A 02/20/2021   Procedure: CORONARY STENT INTERVENTION;  Surgeon: Leonie Man, MD;  Location: [facility];  Service: Cardiovascular;  Laterality: N/A;   CORONARY STENT PLACEMENT     2 overlapping Promus DES (covering existing BMS) in RCA: Promus DES 3.5x23 (x 2) -> post-dilated to 3.75  mm   LEFT HEART CATH AND CORONARY ANGIOGRAPHY N/A 02/20/2021   Procedure: LEFT HEART CATH AND CORONARY ANGIOGRAPHY;  Surgeon: Leonie Man, MD;   Location: [facility];  Service: Cardiovascular;  Laterality: N/A;    Allergies: Gadolinium derivatives, Multihance [gadobenate], and Iodine  Medications: Prior to Admission medications   Medication Sig Start Date End Date Taking? Authorizing Provider  aspirin 81 MG tablet Take 81 mg by mouth daily.    [provider]  capecitabine (XELODA) 500 MG tablet Take 3 tablets by mouth every 12 hours. Take within 30 minutes after a meal. Take for 14 days, then off for 7 days. Repeat every 21 days. 09/08/22   Truitt Merle, MD  diphenhydrAMINE (BENADRYL) 50 MG tablet Take 1 hour before ct scan 08/16/22   Daryel November, MD  dorzolamide (TRUSOPT) 2 % ophthalmic solution 1 drop 3 (three) times daily.    [provider]  dorzolamide-timolol (COSOPT) 22.3-6.8 MG/ML ophthalmic solution Place 1 drop into both eyes daily. 08/26/17   [provider]  dronabinol (MARINOL) 2.5 MG capsule Take 1 capsule (2.5 mg total) by mouth 2 (two) times daily before a meal. 09/14/22   Alla Feeling, NP  gabapentin (NEURONTIN) 300 MG capsule Take 1 capsule (300 mg total) by mouth 2 (two) times daily. 08/27/22   [place], Betty G, MD  HYDROcodone-acetaminophen (NORCO/VICODIN) 5-325 MG tablet Take 1 tablet by mouth at bedtime as needed for moderate pain. 08/27/22   [place], Betty G, MD  loperamide (IMODIUM A-D) 2 MG tablet Take 1 tablet (2 mg total) by mouth 3 (three) times daily as needed for diarrhea or loose  stools. 09/16/22   Truitt Merle, MD  LORazepam (ATIVAN) 0.5 MG tablet Take 1 tablet (0.5 mg total) by mouth as needed for sleep. 08/30/22   [place], Betty G, MD  LUMIGAN 0.01 % SOLN Place 1 drop into both eyes daily. 10/17/17   [provider]  nitroGLYCERIN (NITROSTAT) 0.4 MG SL tablet Place 1 tablet (0.4 mg total) under the tongue every 5 (five) minutes x 3 doses as needed for chest pain. Patient not taking: Reported on 08/13/2022 02/21/21   Reino Bellis B, NP  omeprazole (PRILOSEC) 40  MG capsule Take 1 capsule (40 mg total) by mouth daily. 09/07/22   Truitt Merle, MD  ondansetron (ZOFRAN-ODT) 8 MG disintegrating tablet Take 1 tablet (8 mg total) by mouth every 8 (eight) hours as needed for nausea or vomiting. 09/16/22   Truitt Merle, MD  predniSONE (DELTASONE) 50 MG tablet Take 1 tab 13 hours before the CT scan Take 1 tab 7 hours before the CT scan Take 1 tab 1 hour before the CT scan 08/16/22   Daryel November, MD  sertraline (ZOLOFT) 50 MG tablet Take 50 mg by mouth daily. 07/06/22   [provider]  traMADol (ULTRAM) 50 MG tablet Take 1 tablet (50 mg total) by mouth every 6 (six) hours as needed. 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  All other systems are negative.  Vital Signs: There were no vitals taken for this visit.  Advance Care Plan: The advanced care plan/surrogate  decision maker was discussed at the time of visit and documented in the medical record.   No physical examination was performed in lieu of virtual telephone clinic visit.   Imaging:    Labs:  CBC: Recent Labs    06/09/22 1721 06/28/22 0900 08/23/22 1614 09/14/22 1021  WBC 7.8 7.1 5.4 8.2  HGB 12.9* 13.4 13.0 12.9*  HCT 40.6 40.6 38.7* 37.6*  PLT 270 225.0 173 192    COAGS: Recent Labs    08/13/22 0956  INR 1.1*    BMP: Recent Labs    06/09/22 1721 08/23/22 1614 09/14/22 1021  NA  136 138 137  K 4.4 4.3 4.1  CL 102 108 105  CO2 '24 25 25  '$ GLUCOSE 134* 117* 155*  BUN '17 19 20  '$ CALCIUM 9.0 8.7* 8.7*  CREATININE 1.11 0.91 0.95  GFRNONAA >60 >60 >60    LIVER FUNCTION TESTS: Recent Labs    06/09/22 1721 08/13/22 0956 08/23/22 1614 09/14/22 1021  BILITOT 1.6* 1.8* 1.3* 9.2*  AST 193* 62* 68* 125*  ALT 313* 55* 53* 112*  ALKPHOS 491* 490* 461* 705*  PROT 7.4 7.3 6.8 6.4*  ALBUMIN 2.9* 3.4* 3.2* 3.0*    TUMOR MARKERS: Recent Labs    08/13/22 0956 08/16/22 1602  AFPTM 111.8*  --   CEA  --  5.0*    Assessment and Plan: 81 year old male with history of metastatic rectal cancer and conglomerate left hepatic masses resulting in malignant right sided biliary obstruction.  The most ideal approach for Mr. Paglia regarding his hyperbilirubinemia would be a single session right sided percutaneous cholangiogram with biliary stent placement.  I discussed this plan with his daughter who is agreeable.  We discussed that a contingency plan, if unable to traverse the right sided biliary tree into the common bile duct, would be placement of an external biliary drain that stays in place or is attempted to be internalized at a later date.    Risks and benefits of biliary stent/drain placement were discussed with the patient's daughter including, but not limited to bleeding, infection which may lead to sepsis or even death and damage to adjacent structures.  Plan for  percutaneous cholangiogram with biliary stent placement, possible biliary drain placement, with moderate sedation at [facility] on 09/21/22.  Plan for same day discharge home, cefoxitin antibiotic prophylaxis.     Electronically Signed: Suzette Battiest, MD 09/20/2022, 9:16 AM   I spent a total of  40 Minutes  in virtual telephone clinical consultation, greater than 50% of which was counseling/coordinating care for malignant biliary obstruction.

## 2022-09-20 NOTE — Telephone Encounter (Signed)
Pt's spouse called requesting to speak with CMA.  CMA was unavailable.  Pt was asked if call was regarding a medication refill, as I could help with that.  Pt stated she would rather speak directly to CMA.  Pt asked that CMA call back at their earliest convenience.

## 2022-09-20 NOTE — Telephone Encounter (Signed)
Documented phone call under spouse's chart since it was for her.

## 2022-09-21 ENCOUNTER — Encounter (HOSPITAL_COMMUNITY): Payer: Self-pay

## 2022-09-21 ENCOUNTER — Ambulatory Visit (HOSPITAL_COMMUNITY)
Admission: RE | Admit: 2022-09-21 | Discharge: 2022-09-21 | Disposition: A | Discharge status: Home / Self Care | Payer: Medicare HMO | Source: Ambulatory Visit | Attending: Interventional Radiology | Admitting: Interventional Radiology

## 2022-09-21 ENCOUNTER — Other Ambulatory Visit: Payer: Self-pay | Admitting: Family Medicine

## 2022-09-21 ENCOUNTER — Ambulatory Visit: Payer: Medicare HMO | Admitting: Hematology

## 2022-09-21 ENCOUNTER — Other Ambulatory Visit: Payer: Self-pay

## 2022-09-21 ENCOUNTER — Other Ambulatory Visit: Payer: Self-pay | Admitting: Gastroenterology

## 2022-09-21 ENCOUNTER — Ambulatory Visit
Admission: RE | Admit: 2022-09-21 | Discharge: 2022-09-21 | Disposition: A | Discharge status: Home / Self Care | Payer: Medicare HMO | Source: Ambulatory Visit | Attending: Radiation Oncology | Admitting: Radiation Oncology

## 2022-09-21 ENCOUNTER — Inpatient Hospital Stay: Payer: Medicare HMO

## 2022-09-21 ENCOUNTER — Telehealth: Payer: Self-pay

## 2022-09-21 DIAGNOSIS — C20 Malignant neoplasm of rectum: Secondary | ICD-10-CM | POA: Insufficient documentation

## 2022-09-21 DIAGNOSIS — R197 Diarrhea, unspecified: Secondary | ICD-10-CM | POA: Diagnosis not present

## 2022-09-21 DIAGNOSIS — R748 Abnormal levels of other serum enzymes: Secondary | ICD-10-CM | POA: Diagnosis not present

## 2022-09-21 DIAGNOSIS — R634 Abnormal weight loss: Secondary | ICD-10-CM | POA: Insufficient documentation

## 2022-09-21 DIAGNOSIS — R1012 Left upper quadrant pain: Secondary | ICD-10-CM | POA: Diagnosis not present

## 2022-09-21 DIAGNOSIS — C787 Secondary malignant neoplasm of liver and intrahepatic bile duct: Secondary | ICD-10-CM | POA: Insufficient documentation

## 2022-09-21 DIAGNOSIS — R200 Anesthesia of skin: Secondary | ICD-10-CM

## 2022-09-21 DIAGNOSIS — R17 Unspecified jaundice: Secondary | ICD-10-CM | POA: Diagnosis not present

## 2022-09-21 DIAGNOSIS — K831 Obstruction of bile duct: Secondary | ICD-10-CM | POA: Diagnosis present

## 2022-09-21 HISTORY — PX: IR BILIARY STENT(S) NEW ACCESS WITH DRAIN: IMG6050

## 2022-09-21 LAB — CBC WITH DIFFERENTIAL/PLATELET
Abs Immature Granulocytes: 0.09 10*3/uL — ABNORMAL HIGH (ref 0.00–0.07)
Abs Immature Granulocytes: 0.13 10*3/uL — ABNORMAL HIGH (ref 0.00–0.07)
Basophils Absolute: 0 10*3/uL (ref 0.0–0.1)
Basophils Absolute: 0 10*3/uL (ref 0.0–0.1)
Basophils Relative: 1 %
Basophils Relative: 0 %
Eosinophils Absolute: 0.1 10*3/uL (ref 0.0–0.5)
Eosinophils Absolute: 0.1 10*3/uL (ref 0.0–0.5)
Eosinophils Relative: 1 %
Eosinophils Relative: 2 %
HCT: 34.8 % — ABNORMAL LOW (ref 39.0–52.0)
HCT: 35.5 % — ABNORMAL LOW (ref 39.0–52.0)
Hemoglobin: 11.9 g/dL — ABNORMAL LOW (ref 13.0–17.0)
Hemoglobin: 12.2 g/dL — ABNORMAL LOW (ref 13.0–17.0)
Immature Granulocytes: 1 %
Immature Granulocytes: 2 %
Lymphocytes Relative: 10 %
Lymphocytes Relative: 12 %
Lymphs Abs: 0.7 10*3/uL (ref 0.7–4.0)
Lymphs Abs: 0.8 10*3/uL (ref 0.7–4.0)
MCH: 32.3 pg (ref 26.0–34.0)
MCH: 32.7 pg (ref 26.0–34.0)
MCHC: 34.2 g/dL (ref 30.0–36.0)
MCHC: 34.4 g/dL (ref 30.0–36.0)
MCV: 94.6 fL (ref 80.0–100.0)
MCV: 95.2 fL (ref 80.0–100.0)
Monocytes Absolute: 0.6 10*3/uL (ref 0.1–1.0)
Monocytes Absolute: 0.8 10*3/uL (ref 0.1–1.0)
Monocytes Relative: 9 %
Monocytes Relative: 12 %
Neutro Abs: 4.9 10*3/uL (ref 1.7–7.7)
Neutro Abs: 5.1 10*3/uL (ref 1.7–7.7)
Neutrophils Relative %: 78 %
Neutrophils Relative %: 72 %
Platelets: 177 10*3/uL (ref 150–400)
Platelets: 222 10*3/uL (ref 150–400)
RBC: 3.68 MIL/uL — ABNORMAL LOW (ref 4.22–5.81)
RBC: 3.73 MIL/uL — ABNORMAL LOW (ref 4.22–5.81)
RDW: 17.3 % — ABNORMAL HIGH (ref 11.5–15.5)
RDW: 17.5 % — ABNORMAL HIGH (ref 11.5–15.5)
WBC: 6.4 10*3/uL (ref 4.0–10.5)
WBC: 7 10*3/uL (ref 4.0–10.5)
nRBC: 0 % (ref 0.0–0.2)
nRBC: 0 % (ref 0.0–0.2)

## 2022-09-21 LAB — COMPREHENSIVE METABOLIC PANEL
ALT: 65 U/L — ABNORMAL HIGH (ref 0–44)
ALT: 67 U/L — ABNORMAL HIGH (ref 0–44)
AST: 80 U/L — ABNORMAL HIGH (ref 15–41)
AST: 83 U/L — ABNORMAL HIGH (ref 15–41)
Albumin: 2.5 g/dL — ABNORMAL LOW (ref 3.5–5.0)
Albumin: 1.9 g/dL — ABNORMAL LOW (ref 3.5–5.0)
Alkaline Phosphatase: 532 U/L — ABNORMAL HIGH (ref 38–126)
Alkaline Phosphatase: 486 U/L — ABNORMAL HIGH (ref 38–126)
Anion gap: 6 (ref 5–15)
Anion gap: 9 (ref 5–15)
BUN: 32 mg/dL — ABNORMAL HIGH (ref 8–23)
BUN: 32 mg/dL — ABNORMAL HIGH (ref 8–23)
CO2: 26 mmol/L (ref 22–32)
CO2: 24 mmol/L (ref 22–32)
Calcium: 8.2 mg/dL — ABNORMAL LOW (ref 8.9–10.3)
Calcium: 8.3 mg/dL — ABNORMAL LOW (ref 8.9–10.3)
Chloride: 102 mmol/L (ref 98–111)
Chloride: 101 mmol/L (ref 98–111)
Creatinine, Ser: 1.12 mg/dL (ref 0.61–1.24)
Creatinine, Ser: 1.25 mg/dL — ABNORMAL HIGH (ref 0.61–1.24)
GFR, Estimated: >60 mL/min (ref >60)
GFR, Estimated: 57 mL/min — ABNORMAL LOW (ref >60)
Glucose, Bld: 155 mg/dL — ABNORMAL HIGH (ref 70–99)
Glucose, Bld: 112 mg/dL — ABNORMAL HIGH (ref 70–99)
Potassium: 4.9 mmol/L (ref 3.5–5.1)
Potassium: 4.8 mmol/L (ref 3.5–5.1)
Sodium: 134 mmol/L — ABNORMAL LOW (ref 135–145)
Sodium: 134 mmol/L — ABNORMAL LOW (ref 135–145)
Total Bilirubin: 6.3 mg/dL (ref 0.3–1.2)
Total Bilirubin: 6 mg/dL — ABNORMAL HIGH (ref 0.3–1.2)
Total Protein: 5.5 g/dL — ABNORMAL LOW (ref 6.5–8.1)
Total Protein: 5.3 g/dL — ABNORMAL LOW (ref 6.5–8.1)

## 2022-09-21 LAB — RAD ONC ARIA SESSION SUMMARY
Course Elapsed Days: 1
Plan Fractions Treated to Date: 2
Plan Fractions Treated to Date: 2
Plan Prescribed Dose Per Fraction: 4 Gy
Plan Prescribed Dose Per Fraction: 5 Gy
Plan Total Fractions Prescribed: 5
Plan Total Fractions Prescribed: 5
Plan Total Prescribed Dose: 20 Gy
Plan Total Prescribed Dose: 25 Gy
Reference Point Dosage Given to Date: 10 Gy
Reference Point Dosage Given to Date: 8 Gy
Reference Point Session Dosage Given: 4 Gy
Reference Point Session Dosage Given: 5 Gy
Session Number: 2

## 2022-09-21 LAB — PROTIME-INR
INR: 1.2 (ref 0.8–1.2)
Prothrombin Time: 14.8 seconds (ref 11.4–15.2)

## 2022-09-21 LAB — CEA (IN HOUSE-CHCC): CEA (CHCC-In House): 12.6 ng/mL — ABNORMAL HIGH (ref 0.00–5.00)

## 2022-09-21 MED ORDER — MIDAZOLAM HCL 2 MG/2ML IJ SOLN
INTRAMUSCULAR | Status: AC
  Filled 2022-09-21: qty 4

## 2022-09-21 MED ORDER — ATROPINE SULFATE 1 MG/10ML IJ SOSY
PREFILLED_SYRINGE | INTRAMUSCULAR | Status: AC
  Filled 2022-09-21: qty 10

## 2022-09-21 MED ORDER — LIDOCAINE HCL 1 % IJ SOLN
INTRAMUSCULAR | Status: AC
  Filled 2022-09-21: qty 20

## 2022-09-21 MED ORDER — IOHEXOL 300 MG/ML  SOLN
100.0000 mL | Freq: Once | INTRAMUSCULAR | Status: AC | PRN
  Administered 2022-09-21: 10 mL

## 2022-09-21 MED ORDER — FENTANYL CITRATE (PF) 100 MCG/2ML IJ SOLN
INTRAMUSCULAR | Status: AC | PRN
  Administered 2022-09-21: 25 ug via INTRAVENOUS
  Administered 2022-09-21: 50 ug via INTRAVENOUS

## 2022-09-21 MED ORDER — SODIUM CHLORIDE 0.9 % IV SOLN: INTRAVENOUS | Status: DC

## 2022-09-21 MED ORDER — SODIUM CHLORIDE 0.9 % IV SOLN
2.0000 g | INTRAVENOUS | Status: AC
  Administered 2022-09-21: 2 g via INTRAVENOUS
  Filled 2022-09-21 (×2): qty 2

## 2022-09-21 MED ORDER — IOHEXOL 300 MG/ML  SOLN
50.0000 mL | Freq: Once | INTRAMUSCULAR | Status: AC | PRN
  Administered 2022-09-21: 50 mL

## 2022-09-21 MED ORDER — FENTANYL CITRATE (PF) 100 MCG/2ML IJ SOLN
INTRAMUSCULAR | Status: AC
  Filled 2022-09-21: qty 2

## 2022-09-21 MED ORDER — MIDAZOLAM HCL 2 MG/2ML IJ SOLN
INTRAMUSCULAR | Status: AC | PRN
  Administered 2022-09-21: 1 mg via INTRAVENOUS

## 2022-09-21 NOTE — Procedures (Signed)
Interventional Radiology Procedure Note  Procedure:  1) Percutaneous cholangiogram 2) Biliary stent placement  Findings: Please refer to procedural dictation for full description. Right biliary access with cholangiogram demonstrating malignant hilar occlusion.  Recanalization of hilar occlusion with catheter and wire followed by placement of 8 mm x 6 cm Viabil biliary stent.  Post deployment balloon molding to 8 mm.  Subcapsular hepatic track embolization with 5 mm MREye coil.    Complications: None immediate  Estimated Blood Loss: < 5 mL  Recommendations: 1 hour bedrest, then discharge home after 2 hours observation. Follow up with IR as needed.   Ruthann Cancer, MD Pager: [phone]

## 2022-09-21 NOTE — H&P (Signed)
Chief Complaint: Patient was seen in consultation today for cholangiogram with biliary drain vs stent placement at the request of Dr Ky Barban   Supervising Physician: Ruthann Cancer  Patient Status: [facility] - Out-pt  History of Present Illness: [name] is a 81 y.o. 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  The most ideal approach for [name] regarding his hyperbilirubinemia would be a single session right sided percutaneous cholangiogram with biliary stent placement.  I discussed this plan with his daughter who is agreeable.  We discussed that a contingency plan, if unable to traverse the right sided biliary tree into the common bile duct, would be placement of an external biliary drain that stays in place or is attempted to be internalized at a later date.   Risks and benefits of biliary stent/drain placement were discussed with the patient's daughter including, but not limited to bleeding, infection which may lead to sepsis or even death and damage to adjacent structures.   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5x23 (x 2) -> post-dilated to 3.75  mm   IR RADIOLOGIST EVAL & MGMT  09/20/2022   LEFT HEART CATH AND CORONARY ANGIOGRAPHY N/A 02/20/2021   Procedure: LEFT HEART CATH AND CORONARY ANGIOGRAPHY;  Surgeon: Leonie Man, MD;  Location: [place] [facility];  Service: Cardiovascular;  Laterality: N/A;    Allergies: Codeine, Gadolinium derivatives, Multihance [gadobenate], and Iodine  Medications: Prior to Admission medications   Medication Sig Start Date End Date Taking? Authorizing Provider  dorzolamide-timolol (COSOPT) 22.3-6.8 MG/ML ophthalmic solution Place 1 drop into both eyes daily. 08/26/17  Yes [provider]  dronabinol (MARINOL) 2.5 MG capsule Take 1 capsule (2.5 mg total) by mouth 2 (two) times daily before a meal. 09/14/22  Yes Alla Feeling, NP  gabapentin (NEURONTIN) 300 MG capsule Take 1 capsule (300 mg total) by mouth 2 (two) times daily. Patient taking differently: Take 600 mg by mouth 2 (two) times daily. 08/27/22  Yes Martinique, Betty G, MD  loperamide (IMODIUM A-D) 2 MG tablet Take 1 tablet (2 mg total) by mouth 3 (three) times daily as needed for diarrhea or loose stools. 09/16/22  Yes Truitt Merle, MD  LORazepam (ATIVAN) 0.5 MG tablet Take 1 tablet (0.5 mg total)  by mouth as needed for sleep. 08/30/22  Yes [place], Betty G, MD  LUMIGAN 0.01 % SOLN Place 1 drop into both eyes at bedtime. 10/17/17  Yes [provider]  omeprazole (PRILOSEC) 40 MG capsule Take 1 capsule (40 mg total) by mouth daily. 09/07/22  Yes Truitt Merle, MD  sertraline (ZOLOFT) 50 MG tablet Take 50 mg by mouth daily. 07/06/22  Yes [provider]  capecitabine (XELODA) 500 MG  tablet Take 3 tablets by mouth every 12 hours. Take within 30 minutes after a meal. Take for 14 days, then off for 7 days. Repeat every 21 days. 09/08/22   Truitt Merle, MD  HYDROcodone-acetaminophen (NORCO/VICODIN) 5-325 MG tablet Take 1 tablet by mouth at bedtime as needed for moderate pain. Patient not taking: Reported on 09/20/2022 08/27/22   [place], Betty G, MD  nitroGLYCERIN (NITROSTAT) 0.4 MG SL tablet Place 1 tablet (0.4 mg total) under the tongue every 5 (five) minutes x 3 doses as needed for chest pain. 02/21/21   Cheryln Manly, NP  ondansetron (ZOFRAN-ODT) 8 MG disintegrating tablet Take 1 tablet (8 mg total) by mouth every 8 (eight) hours as needed for nausea or vomiting. 09/16/22   Truitt Merle, MD  traMADol (ULTRAM) 50 MG tablet Take 1 tablet (50 mg total) by mouth every 6 (six) hours as needed. Patient not taking: Reported on 09/20/2022 09/07/22   Truitt Merle, MD     Family History  Problem Relation Age of Onset   Colon cancer Neg Hx    Esophageal cancer Neg Hx    Stomach cancer Neg Hx    Rectal cancer Neg Hx     Social History   Socioeconomic History   Marital status: Married    Spouse name: Not on file   Number of children: 3   Years of education: Not on file   Highest education level: Not on file  Occupational History   Not on file  Tobacco Use   Smoking status: Never   Smokeless tobacco: Never  Vaping Use   Vaping Use: Never used  Substance and Sexual Activity   Alcohol use: Not Currently    Comment: 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  All other systems are negative.  Vital Signs: BP 100/66   Pulse (!) 55   Temp 97.6 F (36.4 C) (Temporal)   Resp 16   Ht '5\' 11"'$  (1.803 m)   Wt 160 lb (72.6 kg)   SpO2 95%   BMI 22.32 kg/m    Physical Exam Vitals reviewed.  Constitutional:      Appearance: He is ill-appearing.     Comments: thin  HENT:     Mouth/Throat:     Mouth: Mucous membranes are moist.  Cardiovascular:     Rate and Rhythm: Normal rate and regular rhythm.     Heart sounds: Normal heart sounds.  Pulmonary:     Effort: Pulmonary effort is normal.  Breath sounds: Normal breath sounds.  Abdominal:     Palpations: Abdomen is soft.  Musculoskeletal:        General: Normal range of motion.  Skin:    General: Skin is warm.  Neurological:     Mental Status: He is alert and oriented to person, place, and time.  Psychiatric:        Behavior: Behavior normal.     Imaging: [place] ELASTOGRAPHY LIVER  Result Date: 09/20/2022 CLINICAL DATA:  LEFT upper quadrant abdominal pain, diarrhea, weight loss, elevated LFTs, R 10.12, R 19.7, R 74.8, R 63.4 EXAM: [place] ELASTOGRAPHY HEPATIC TECHNIQUE: Sonography of the liver was performed. In addition, ultrasound elastography evaluation of the liver was performed. A region of interest was placed within the right lobe of the liver. Following application of a compressive sonographic pulse, tissue compressibility was assessed. Multiple assessments were performed at the selected site. Median tissue compressibility was determined. Previously, hepatic stiffness was assessed by shear wave velocity. Based on recently published Society of Radiologists in Ultrasound consensus article, reporting is now recommended to be performed in the SI units of pressure (kiloPascals) representing hepatic stiffness/elasticity. The obtained result is compared to the published reference standards. (cACLD = compensated Advanced Chronic Liver Disease) COMPARISON:  09/15/2022  FINDINGS: Liver: Heterogeneous increased hepatic echogenicity. Micronodular contour questioned. No focal hepatic mass or intrahepatic biliary dilatation. Portal vein is patent on color Doppler imaging with normal direction of blood flow towards the liver. ULTRASOUND HEPATIC ELASTOGRAPHY Device: Siemens Helix VTQ Patient position: Supine Transducer: 9C2 Number of measurements: 12 Hepatic segment:  8 Median kPa: 72.3, see below IQR: 24.6 IQR/Median kPa ratio: 0.34 Data quality: NONDIAGNOSTIC EXAM: Ascites is present, which precludes adequate hepatic elastography assessment. Diagnostic category:  N/A The use of hepatic elastography is applicable to patients with viral hepatitis and non-alcoholic fatty liver disease. At this time, there is insufficient data for the referenced cut-off values and use in other causes of liver disease, including alcoholic liver disease. Patients, however, may be assessed by elastography and serve as their own reference standard/baseline. In patients with non-alcoholic liver disease, the values suggesting compensated advanced chronic liver disease (cACLD) may be lower, and patients may need additional testing with elasticity results of 7-9 kPa. Please note that abnormal hepatic elasticity and shear wave velocities may also be identified in clinical settings other than with hepatic fibrosis, such as: acute hepatitis, elevated right heart and central venous pressures including use of beta blockers, veno-occlusive disease (Budd-Chiari), infiltrative processes such as mastocytosis/amyloidosis/infiltrative tumor/lymphoma, extrahepatic cholestasis, with hyperemia in the post-prandial state, and with liver transplantation. Correlation with patient history, laboratory data, and clinical condition recommended. Diagnostic Categories: < or =5 kPa: high probability of being normal < or =9 kPa: in the absence of other known clinical signs, rules out cACLD >9 kPa and ?13 kPa: suggestive of cACLD, but  needs further testing >13 kPa: highly suggestive of cACLD > or =17 kPa: highly suggestive of cACLD with an increased probability of clinically significant portal hypertension IMPRESSION: ULTRASOUND LIVER: Heterogeneous hepatic parenchyma, minimally increased, with question mildly micro nodular margins. Ascites. ULTRASOUND HEPATIC ELASTOGRAPHY: Nondiagnostic exam due to presence of ascites. May consider repeating exam following resolution of ascites, or may consider follow-up MR evaluation of the liver with and without contrast to assess for cirrhosis. Electronically Signed   By: Lavonia Dana M.D.   On: 09/20/2022 13:23   IR Radiologist Eval & Mgmt  Result Date: 09/20/2022 EXAM: NEW PATIENT OFFICE VISIT CHIEF COMPLAINT: See epic note.  HISTORY OF PRESENT ILLNESS: See epic note. REVIEW OF SYSTEMS: See epic note. PHYSICAL EXAMINATION: See epic note. ASSESSMENT AND PLAN: See epic note. Ruthann Cancer, MD [facility] Radiology Electronically Signed   By: Ruthann Cancer M.D.   On: 09/20/2022 09:48   US Abdomen Limited RUQ (LIVER/GB)  Result Date: 09/15/2022 CLINICAL DATA:  Jaundice. Metastatic rectal cancer with known liver metastases. EXAM: ULTRASOUND ABDOMEN LIMITED RIGHT UPPER QUADRANT COMPARISON:  Abdominal MRI 09/02/2022 FINDINGS: Gallbladder: Surgically absent. Common bile duct: Diameter: 6 mm Liver: Diffusely heterogeneous, nodular appearance of the liver corresponding to numerous masses on MRI. Grossly similar appearance of intrahepatic biliary dilatation compared to the prior MRI. Portal vein is patent on color Doppler imaging with normal direction of blood flow towards the liver. Other: Small volume perihepatic free fluid. IMPRESSION: 1. Known liver metastases and intrahepatic biliary dilatation. 2. Normal caliber of the common bile duct. 3. Small volume perihepatic free fluid. Electronically Signed   By: Logan Bores M.D.   On: 09/15/2022 13:33   [place]  ASCITES (ABDOMEN LIMITED)  Result Date: 09/15/2022 CLINICAL DATA:  Ascites. EXAM: LIMITED ABDOMEN ULTRASOUND FOR ASCITES TECHNIQUE: Limited ultrasound survey for ascites was performed in all four abdominal quadrants. COMPARISON:  CT abdomen and pelvis 07/02/2022 FINDINGS: Only a small amount of free fluid was visualized in the right upper, left upper, and left lower quadrants. The study was performed with Reatha Armour, PA, from interventional radiology, and paracentesis was not performed due to inadequate fluid. IMPRESSION: Small volume ascites. Paracentesis not performed. Electronically Signed   By: Logan Bores M.D.   On: 09/15/2022 13:28   MR PELVIS WO CM RECTAL CA STAGING  Result Date: 09/06/2022 CLINICAL DATA:  Rectal cancer.  Staging.  Hematochezia for 6 months. EXAM: MRI PELVIS WITHOUT CONTRAST TECHNIQUE: Multiplanar multisequence MR imaging of the pelvis was performed. No intravenous contrast was administered. Ultrasound gel was administered per rectum to optimize tumor evaluation. COMPARISON:  Abdominopelvic CT 07/02/2022 and 06/11/2020. FINDINGS: TUMOR LOCATION Tumor distance from Anal Verge/Skin Surface: 12.3 cm on 20/2. Tumor distance to Internal Anal Sphincter: 8.9 cm on 26/4 TUMOR DESCRIPTION Circumferential Extent: Entirely circumferential, including on 31/5 Tumor Length: 4.1 cm in 20/2. T - CATEGORY Extension through Muscularis Propria: Maximally 5 mm at the 2 o'clock position on 36/5. T3b Shortest Distance of any tumor/node from Mesorectal Fascia: 5 mm on 36/5 Extramural Vascular Invasion/Tumor Thrombus: No Invasion of Anterior Peritoneal Reflection: No Involvement of Adjacent Organs or Pelvic Sidewall: No Levator Ani Involvement: No N - CATEGORY Mesorectal Lymph Nodes >=4m: An isolated 7 mm node within the high mesorectum/inferior sigmoid mesocolon on 06/12. N1 Extra-mesorectal Lymphadenopathy: No Other: Small volume free pelvic fluid, new since 07/02/2022. No bowel obstruction. Moderate  prostatomegaly. Normal urinary bladder. Tiny fat containing right inguinal hernia. IMPRESSION: Rectal adenocarcinoma T stage: T3 B Rectal adenocarcinoma N stage:  N1 Distance from tumor to the internal anal sphincter is 8.9 cm Electronically Signed   By: KAbigail MiyamotoM.D.   On: 09/06/2022 13:38   MR Abdomen W Wo Contrast  Result Date: 09/04/2022 CLINICAL DATA:  Cirrhosis.  Elevated AFP.  Rectal carcinoma. EXAM: MRI ABDOMEN WITHOUT AND WITH CONTRAST TECHNIQUE: Multiplanar multisequence MR imaging of the abdomen was performed both before and after the administration of intravenous contrast. CONTRAST:  7 mL Vueway COMPARISON:  Noncontrast CT on 07/02/2022 FINDINGS: Lower chest: No acute findings. Hepatobiliary: A large mass is seen involving the majority of the left hepatic lobe which is largely  necrotic measuring approximately 16.3 x 7.9 cm. Elsewhere throughout the liver are innumerable small peripherally enhancing masses, which nearly all measure less than 2 cm in size. Enhancement pattern favors diffuse metastatic disease, however diffuse multifocal hepatocellular carcinoma cannot be excluded. Intrahepatic biliary ductal dilatation is seen throughout the right hepatic lobe due to obstruction in the central liver hilum. Gallbladder is not visualized and there is no evidence of dilatation of the common bile duct. Pancreas: No mass or inflammatory changes. No evidence of pancreatic ductal dilatation. Spleen:  Within normal limits in size and appearance. Adrenals/Urinary Tract: No suspicious masses identified. No evidence of hydronephrosis. Stomach/Bowel: Unremarkable. Vascular/Lymphatic: Lymphadenopathy with central necrosis is seen in the porta hepatis, largest measuring 2.5 cm short axis on image 16/16. No acute vascular findings. Other:  Moderate ascites noted. Musculoskeletal:  No suspicious bone lesions identified. IMPRESSION: Diffuse hepatic involvement by numerous masses, largest nearly replacing the left  hepatic lobe and showing diffuse necrosis. Characteristics favor diffuse hepatic metastatic disease, however diffuse multifocal hepatocellular carcinoma cannot be excluded. Tissue sampling should be considered. Porta hepatis lymphadenopathy, consistent with metastatic disease. Moderate ascites. Intrahepatic biliary ductal dilatation throughout the right hepatic lobe due to obstruction in the central liver hilum. Electronically Signed   By: Marlaine Hind M.D.   On: 09/04/2022 09:45   CT Chest Wo Contrast  Result Date: 09/03/2022 CLINICAL DATA:  Rectal cancer staging. * Tracking Code: BO * EXAM: CT CHEST WITHOUT CONTRAST TECHNIQUE: Multidetector CT imaging of the chest was performed following the standard protocol without IV contrast. RADIATION DOSE REDUCTION: This exam was performed according to the departmental dose-optimization program which includes automated exposure control, adjustment of the mA and/or kV according to patient size and/or use of iterative reconstruction technique. COMPARISON:  None Available. FINDINGS: Cardiovascular: Normal heart size. No significant pericardial effusion/thickening. Three-vessel coronary atherosclerosis. Atherosclerotic nonaneurysmal thoracic aorta. Normal caliber pulmonary arteries. Mediastinum/Nodes: No significant thyroid nodules. Small fluid level in the midportion of the mildly patulous thoracic esophagus. No axillary adenopathy. Mildly enlarged 1.0 cm short axis diameter left internal mammary lymph node (series 2/image 65). Lungs/Pleura: No pneumothorax. No pleural effusion. No acute consolidative airspace disease or lung masses. Three small solid pulmonary nodules scattered at the lung bases bilaterally, largest 0.5 cm in the posterior left lower lobe (series 5/image 98). Upper abdomen: Extensive confluent hypodense liver masses throughout the visualized liver, most prominent in the left liver lobe, poorly delineated on this noncontrast CT. Cholecystectomy. Small  volume ascites. Musculoskeletal: No aggressive appearing focal osseous lesions. Moderate thoracic spondylosis. IMPRESSION: 1. Mildly enlarged left internal mammary lymph node, suspicious for nodal metastasis. 2. Three small solid pulmonary nodules scattered at the lung bases bilaterally, largest 0.5 cm, cannot exclude pulmonary metastases. Recommend attention on follow-up chest CT in 3 months. 3. Extensive confluent hypodense liver masses throughout the visualized liver, poorly delineated on this noncontrast CT, suspicious for liver metastases, please see the separate same-day MRI abdomen report for details. 4. Small volume ascites. 5. Three-vessel coronary atherosclerosis. 6.  Aortic Atherosclerosis (ICD10-I70.0). Electronically Signed   By: Ilona Sorrel M.D.   On: 09/03/2022 17:44    Labs:  CBC: Recent Labs    08/23/22 1614 09/14/22 1021 09/21/22 1126 09/21/22 1308  WBC 5.4 8.2 6.4 7.0  HGB 13.0 12.9* 11.9* 12.2*  HCT 38.7* 37.6* 34.8* 35.5*  PLT 173 192 177 222    COAGS: Recent Labs    08/13/22 0956 09/21/22 1308  INR 1.1* 1.2    BMP: Recent Labs  06/09/22 1721 08/23/22 1614 09/14/22 1021 09/21/22 1126  NA 136 138 137 134*  K 4.4 4.3 4.1 4.9  CL 102 108 105 102  CO2 '24 25 25 26  '$ GLUCOSE 134* 117* 155* 155*  BUN '17 19 20 '$ 32*  CALCIUM 9.0 8.7* 8.7* 8.2*  CREATININE 1.11 0.91 0.95 1.12  GFRNONAA >60 >60 >60 >60    LIVER FUNCTION TESTS: Recent Labs    08/13/22 0956 08/23/22 1614 09/14/22 1021 09/21/22 1126  BILITOT 1.8* 1.3* 9.2* 6.3*  AST 62* 68* 125* 80*  ALT 55* 53* 112* 65*  ALKPHOS 490* 461* 705* 532*  PROT 7.3 6.8 6.4* 5.5*  ALBUMIN 3.4* 3.2* 3.0* 2.5*    TUMOR MARKERS: Recent Labs    08/13/22 0956 08/16/22 1602  AFPTM 111.8*  --   CEA  --  5.0*    Assessment and Plan:  Rectal cancer Liver metastasis presumed Malignant obstruction Hyperbilirubinemia; jaundice Scheduled today for percutaneous cholangiogram with possible drain vs stent  placement Risks and benefits of cholangiogram with possible biliary drain vs stent placement discussed with the patient including, but not limited to bleeding, infection which may lead to sepsis or even death and damage to adjacent structures.  This interventional procedure involves the use of X-rays and because of the nature of the planned procedure, it is possible that we will have prolonged use of X-ray fluoroscopy.  Potential radiation risks to you include (but are not limited to) the following: - A slightly elevated risk for cancer  several years later in life. This risk is typically less than 0.5% percent. This risk is low in comparison to the normal incidence of human cancer, which is 33% for women and 50% for men according to the [place]. - Radiation induced injury can include skin redness, resembling a rash, tissue breakdown / ulcers and hair loss (which can be temporary or permanent).   The likelihood of either of these occurring depends on the difficulty of the procedure and whether you are sensitive to radiation due to previous procedures, disease, or genetic conditions.   IF your procedure requires a prolonged use of radiation, you will be notified and given written instructions for further action.  It is your responsibility to monitor the irradiated area for the 2 weeks following the procedure and to notify your physician if you are concerned that you have suffered a radiation induced injury.    All of the patient's questions were answered, patient is agreeable to proceed.  Consent signed and in chart.  Thank you for this interesting consult.  I greatly enjoyed meeting Kamden Monroe Quattrone and look forward to participating in their care.  A copy of this report was sent to the requesting provider on this date.  Electronically Signed: Lavonia Drafts, PA-C 09/21/2022, 2:16 PM   I spent a total of    25 Minutes in face to face in clinical consultation, greater  than 50% of which was counseling/coordinating care for biliary drain vs stent placement

## 2022-09-21 NOTE — Telephone Encounter (Signed)
Critical Lab Value reported:  Tbili 6.3 Notified Dr. Burr Medico verbally.  Previous Tbili was 9.3 trending down.  Dr. Burr Medico stated to make sure pt have a f/u appt with her within the next week.

## 2022-09-22 ENCOUNTER — Ambulatory Visit
Admission: RE | Admit: 2022-09-22 | Discharge: 2022-09-22 | Disposition: A | Discharge status: Home / Self Care | Payer: Medicare HMO | Source: Ambulatory Visit | Attending: Radiation Oncology | Admitting: Radiation Oncology

## 2022-09-22 ENCOUNTER — Other Ambulatory Visit: Payer: Self-pay

## 2022-09-22 DIAGNOSIS — Z510 Encounter for antineoplastic radiation therapy: Secondary | ICD-10-CM | POA: Diagnosis not present

## 2022-09-22 LAB — RAD ONC ARIA SESSION SUMMARY
Course Elapsed Days: 2
Plan Fractions Treated to Date: 3
Plan Fractions Treated to Date: 3
Plan Prescribed Dose Per Fraction: 4 Gy
Plan Prescribed Dose Per Fraction: 5 Gy
Plan Total Fractions Prescribed: 5
Plan Total Fractions Prescribed: 5
Plan Total Prescribed Dose: 20 Gy
Plan Total Prescribed Dose: 25 Gy
Reference Point Dosage Given to Date: 12 Gy
Reference Point Dosage Given to Date: 15 Gy
Reference Point Session Dosage Given: 4 Gy
Reference Point Session Dosage Given: 5 Gy
Session Number: 3

## 2022-09-23 ENCOUNTER — Inpatient Hospital Stay: Payer: Medicare HMO

## 2022-09-23 ENCOUNTER — Other Ambulatory Visit: Payer: Self-pay

## 2022-09-23 ENCOUNTER — Inpatient Hospital Stay (HOSPITAL_BASED_OUTPATIENT_CLINIC_OR_DEPARTMENT_OTHER): Payer: Medicare HMO | Admitting: Hematology

## 2022-09-23 ENCOUNTER — Encounter: Payer: Self-pay | Admitting: Hematology

## 2022-09-23 ENCOUNTER — Ambulatory Visit
Admission: RE | Admit: 2022-09-23 | Discharge: 2022-09-23 | Disposition: A | Discharge status: Home / Self Care | Payer: Medicare HMO | Source: Ambulatory Visit | Attending: Radiation Oncology | Admitting: Radiation Oncology

## 2022-09-23 VITALS — BP 97/64 | HR 54 | Resp 17

## 2022-09-23 VITALS — BP 91/64 | HR 51 | Temp 97.7°F | Resp 15

## 2022-09-23 DIAGNOSIS — C20 Malignant neoplasm of rectum: Secondary | ICD-10-CM

## 2022-09-23 DIAGNOSIS — Z510 Encounter for antineoplastic radiation therapy: Secondary | ICD-10-CM | POA: Diagnosis not present

## 2022-09-23 LAB — CBC WITH DIFFERENTIAL/PLATELET
Abs Immature Granulocytes: 0.07 10*3/uL (ref 0.00–0.07)
Basophils Absolute: 0 10*3/uL (ref 0.0–0.1)
Basophils Relative: 0 %
Eosinophils Absolute: 0.1 10*3/uL (ref 0.0–0.5)
Eosinophils Relative: 1 %
HCT: 35.8 % — ABNORMAL LOW (ref 39.0–52.0)
Hemoglobin: 12.2 g/dL — ABNORMAL LOW (ref 13.0–17.0)
Immature Granulocytes: 1 %
Lymphocytes Relative: 5 %
Lymphs Abs: 0.3 10*3/uL — ABNORMAL LOW (ref 0.7–4.0)
MCH: 32.8 pg (ref 26.0–34.0)
MCHC: 34.1 g/dL (ref 30.0–36.0)
MCV: 96.2 fL (ref 80.0–100.0)
Monocytes Absolute: 0.4 10*3/uL (ref 0.1–1.0)
Monocytes Relative: 6 %
Neutro Abs: 6.4 10*3/uL (ref 1.7–7.7)
Neutrophils Relative %: 87 %
Platelets: 162 10*3/uL (ref 150–400)
RBC: 3.72 MIL/uL — ABNORMAL LOW (ref 4.22–5.81)
RDW: 17.7 % — ABNORMAL HIGH (ref 11.5–15.5)
WBC: 7.3 10*3/uL (ref 4.0–10.5)
nRBC: 0 % (ref 0.0–0.2)

## 2022-09-23 LAB — COMPREHENSIVE METABOLIC PANEL
ALT: 69 U/L — ABNORMAL HIGH (ref 0–44)
AST: 103 U/L — ABNORMAL HIGH (ref 15–41)
Albumin: 2.5 g/dL — ABNORMAL LOW (ref 3.5–5.0)
Alkaline Phosphatase: 592 U/L — ABNORMAL HIGH (ref 38–126)
Anion gap: 6 (ref 5–15)
BUN: 38 mg/dL — ABNORMAL HIGH (ref 8–23)
CO2: 26 mmol/L (ref 22–32)
Calcium: 8.3 mg/dL — ABNORMAL LOW (ref 8.9–10.3)
Chloride: 102 mmol/L (ref 98–111)
Creatinine, Ser: 1.17 mg/dL (ref 0.61–1.24)
GFR, Estimated: >60 mL/min (ref >60)
Glucose, Bld: 178 mg/dL — ABNORMAL HIGH (ref 70–99)
Potassium: 4.6 mmol/L (ref 3.5–5.1)
Sodium: 134 mmol/L — ABNORMAL LOW (ref 135–145)
Total Bilirubin: 6.3 mg/dL (ref 0.3–1.2)
Total Protein: 5.5 g/dL — ABNORMAL LOW (ref 6.5–8.1)

## 2022-09-23 LAB — RAD ONC ARIA SESSION SUMMARY
Course Elapsed Days: 3
Plan Fractions Treated to Date: 4
Plan Fractions Treated to Date: 4
Plan Prescribed Dose Per Fraction: 4 Gy
Plan Prescribed Dose Per Fraction: 5 Gy
Plan Total Fractions Prescribed: 5
Plan Total Fractions Prescribed: 5
Plan Total Prescribed Dose: 20 Gy
Plan Total Prescribed Dose: 25 Gy
Reference Point Dosage Given to Date: 16 Gy
Reference Point Dosage Given to Date: 20 Gy
Reference Point Session Dosage Given: 4 Gy
Reference Point Session Dosage Given: 5 Gy
Session Number: 4

## 2022-09-23 MED ORDER — SODIUM CHLORIDE 0.9 % IV SOLN: INTRAVENOUS | Status: AC

## 2022-09-23 MED ORDER — SODIUM CHLORIDE 0.9 % IV SOLN: Freq: Once | INTRAVENOUS | Status: DC

## 2022-09-23 NOTE — Progress Notes (Signed)
[place]   Telephone:([phone] [phone]) [phone]   Clinic Follow up Note   Patient Care Team: [place]ique, Betty G, MD as PCP - General (Family Medicine) Burnell Blanks, MD as PCP - Cardiology (Cardiology) Truitt Merle, MD as Consulting Physician (Oncology) Royston Bake, RN as Oncology Nurse Navigator (Oncology) [place] as Consulting Physician [facility] and Palliative Medicine)  Date of Service:  09/23/2022  CHIEF COMPLAINT: f/u of rectal cancer  CURRENT THERAPY:  Palliative radiation, 10/23 - 09/24/22  ASSESSMENT & PLAN:  [name] is a 81 y.o. male with   1. 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 Colonoscopy on 08/16/22 by Dr. Candis Schatz showed partially obstructing tumor in proximal rectum; path confirmed invasive moderately differentiated adenocarcinoma.  -CT AP without contrast 07/02/22 showed only new left hepatic cirrhosis, no rectal mass, adenopathy or evidence of distant metastasis.  However the scan was done without contrast, which limits the evaluation. -elevated tumor markers-- baseline AFP 08/13/22 at 111.8, repeat CEA on 08/23/22 at 10.77. -staging chest CT 09/02/22 showed: mildly enlarged left internal mammary lymph node; three small pulmonary nodules in lung bases, up to 0.5 cm. Same day abdomen MRI showed: diffuse hepatic involvement by numerous masses, largest nearly replacing left hepatic lobe; porta hepatis lymphadenopathy; moderate ascites. Staged by pelvis MRI as T3b, N1, located 8.9 cm from anal sphincter. -given the metastatic disease, he is not a candidate for surgery, and given his poor PS and advanced age, he is not eligible for IV chemo. -he began palliative radiation to the liver and pelvis under Dr. Lisbeth Renshaw on 09/20/22, last scheduled for tomorrow, 10/27. -labs reviewed, his  CBC is stable, CMP shows worsening LFT's. Given his low appetite and weakness, I recommend IVF. We will proceed with this today. -Due to his poor performance status, recently developed jaundice from liver metastasis, he is not a candidate for chemotherapy.  I discussed palliative care and hospice, he will think about it.  I reviewed the detail of palliative care and hospice with patient, his wife and his daughter.   2. Goal of care discussion, DNR   -We again discussed the incurable nature of his cancer, and the overall poor prognosis, especially given his continued poor PS and recent jaundice. -The patient understands the goal of care is palliative. -I recommend DNR/DNI, he verbally agrees today (09/23/22).  3.  Symptom Management: obstructive jaundice, ascites, abdominal pain, low appetite, progressive weakness -t.bili rose from 1.3 on 08/23/22 to 9.2 on 09/14/22, with worsening LFT's. S/p biliary stent and drain placement on 09/21/22 by IR. -he reports continued low appetite despite marinol. They add he is drinking Ensure, 2-3 times a day. -he reports worsening weakness in his legs and was unable to stand to get his weight today. He has gabapentin for leg pains. -he has tramadol to take as needed, currently not in pain. -I encouraged him to use ativan at night for sleep. I also reviewed that gabapentin can make him sleepy.   4. Newly diagnosed liver cirrhosis, heavy alcohol drinking history -liver cirrhosis found on initial CT on 07/02/22 -Follow-up with GI Dr. Candis Schatz     PLAN:  -continue daily radiation, last dose tomorrow 10/27 -proceed with IVF today -urgent referral to palliative care NP Nikki to be seen next week  -lab, f/u, and 2hr IVF in 1 week, will discuss hospice again  -Patient agreed to  DNR today, I documented and gave him a copy    No problem-specific Assessment & Plan notes found for this encounter.   SUMMARY OF ONCOLOGIC HISTORY: Oncology History  Rectal cancer  ([place])  07/02/2022 Imaging   CLINICAL DATA:  Left upper quadrant abdominal pain abnormal LFTs   EXAM: CT ABDOMEN AND PELVIS WITHOUT CONTRAST  IMPRESSION: Shrunken nodular contour of the left hepatic lobe compatible with left hepatic lobe cirrhosis. This finding is new since 2021. Further evaluation with CT or MRI with IV contrast is recommended.   08/13/2022 Tumor Marker   Patient's tumor was tested for the following markers: AFP. Results of the tumor marker test revealed elevation (111.8).   08/16/2022 Procedure   Colonoscopy, Dr. Candis Schatz  Impression: - Preparation of the colon was inadequate. - Hemorrhoids found on perianal exam. - Malignant partially obstructing tumor in the proximal rectum. Biopsied. Tattooed.   08/16/2022 Initial Biopsy   3. Rectum, biopsy, mass - INVASIVE MODERATELY DIFFERENTIATED ADENOCARCINOMA   08/16/2022 Cancer Staging   Staging form: Colon and Rectum, AJCC 8th Edition - Clinical stage from 08/16/2022: Stage IVA (cTX, cN1, cM1a) - Signed by Truitt Merle, MD on 09/07/2022 Histologic grade (G): GX Histologic grading system: 4 grade system   08/23/2022 Initial Diagnosis   Rectal cancer ([place])   09/02/2022 Imaging   EXAM: CT CHEST WITHOUT CONTRAST  IMPRESSION: 1. Mildly enlarged left internal mammary lymph node, suspicious for nodal metastasis. 2. Three small solid pulmonary nodules scattered at the lung bases bilaterally, largest 0.5 cm, cannot exclude pulmonary metastases. Recommend attention on follow-up chest CT in 3 months. 3. Extensive confluent hypodense liver masses throughout the visualized liver, poorly delineated on this noncontrast CT, suspicious for liver metastases, please see the separate same-day MRI abdomen report for details. 4. Small volume ascites. 5. Three-vessel coronary atherosclerosis. 6.  Aortic Atherosclerosis (ICD10-I70.0).   09/02/2022 Imaging   EXAM: MRI ABDOMEN WITHOUT AND WITH CONTRAST  IMPRESSION: Diffuse hepatic  involvement by numerous masses, largest nearly replacing the left hepatic lobe and showing diffuse necrosis. Characteristics favor diffuse hepatic metastatic disease, however diffuse multifocal hepatocellular carcinoma cannot be excluded. Tissue sampling should be considered.   Porta hepatis lymphadenopathy, consistent with metastatic disease.   Moderate ascites.   Intrahepatic biliary ductal dilatation throughout the right hepatic lobe due to obstruction in the central liver hilum.   09/02/2022 Imaging   EXAM: MRI PELVIS WITHOUT CONTRAST, RECTAL CANCER STAGING  IMPRESSION: Rectal adenocarcinoma T stage: T3 B   Rectal adenocarcinoma N stage:  N1   Distance from tumor to the internal anal sphincter is 8.9 cm      INTERVAL HISTORY:  [place] is here for a follow up of rectal cancer. He was last seen by NP Lacie on 09/14/22. He presents to the clinic accompanied by his wife and daughter. He reports he is about the same. He notes he is not eating much.   All other systems were reviewed with the patient and are negative.  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, 07/25/2008 treated with 2 overlapping DES   Depression    Elevated cholesterol    Gallstones    Glaucoma    Hyperlipidemia    with low high-density lipoprotein   Irritable bowel syndrome  Kidney stone    Kidney stones     SURGICAL HISTORY: Past Surgical History:  Procedure Laterality Date   BACK SURGERY     CHOLECYSTECTOMY     CORONARY STENT INTERVENTION N/A 02/20/2021   Procedure: CORONARY STENT INTERVENTION;  Surgeon: Leonie Man, MD;  Location: [facility];  Service: Cardiovascular;  Laterality: N/A;   CORONARY STENT PLACEMENT     2 overlapping Promus DES (covering  existing BMS) in RCA: Promus DES 3.5x23 (x 2) -> post-dilated to 3.75  mm   IR BILIARY STENT(S) NEW ACCESS WITH DRAIN  09/21/2022   IR RADIOLOGIST EVAL & MGMT  09/20/2022   LEFT HEART CATH AND CORONARY ANGIOGRAPHY N/A 02/20/2021   Procedure: LEFT HEART CATH AND CORONARY ANGIOGRAPHY;  Surgeon: Leonie Man, MD;  Location: [facility];  Service: Cardiovascular;  Laterality: N/A;    I have reviewed the social history and family history with the patient and they are unchanged from previous note.  ALLERGIES:  is allergic to codeine, gadolinium derivatives, multihance [gadobenate], and iodine.  MEDICATIONS:  Current Outpatient Medications  Medication Sig Dispense Refill   capecitabine (XELODA) 500 MG tablet Take 3 tablets by mouth every 12 hours. Take within 30 minutes after a meal. Take for 14 days, then off for 7 days. Repeat every 21 days. 84 tablet 0   dorzolamide-timolol (COSOPT) 22.3-6.8 MG/ML ophthalmic solution Place 1 drop into both eyes daily.     dronabinol (MARINOL) 2.5 MG capsule Take 1 capsule (2.5 mg total) by mouth 2 (two) times daily before a meal. 20 capsule 0   gabapentin (NEURONTIN) 300 MG capsule Take 1 capsule (300 mg total) by mouth 2 (two) times daily. (Patient taking differently: Take 600 mg by mouth 2 (two) times daily.) 60 capsule 3   HYDROcodone-acetaminophen (NORCO/VICODIN) 5-325 MG tablet Take 1 tablet by mouth at bedtime as needed for moderate pain. (Patient not taking: Reported on 09/20/2022) 30 tablet 0   loperamide (IMODIUM A-D) 2 MG tablet Take 1 tablet (2 mg total) by mouth 3 (three) times daily as needed for diarrhea or loose stools. 60 tablet 3   LORazepam (ATIVAN) 0.5 MG tablet Take 1 tablet (0.5 mg total) by mouth as needed for sleep. 30 tablet 3   LUMIGAN 0.01 % SOLN Place 1 drop into both eyes at bedtime.     nitroGLYCERIN (NITROSTAT) 0.4 MG SL tablet Place 1 tablet (0.4 mg total) under the tongue every 5 (five) minutes x 3 doses as needed for chest  pain. 25 tablet 2   omeprazole (PRILOSEC) 40 MG capsule Take 1 capsule (40 mg total) by mouth daily. 30 capsule 1   ondansetron (ZOFRAN-ODT) 8 MG disintegrating tablet Take 1 tablet (8 mg total) by mouth every 8 (eight) hours as needed for nausea or vomiting. 20 tablet 0   sertraline (ZOLOFT) 50 MG tablet Take 50 mg by mouth daily.     traMADol (ULTRAM) 50 MG tablet Take 1 tablet (50 mg total) by mouth every 6 (six) hours as needed. (Patient not taking: Reported on 09/20/2022) 20 tablet 0   No current facility-administered medications for this visit.   Facility-Administered Medications Ordered in Other Visits  Medication Dose Route Frequency Provider Last Rate Last Admin   0.9 %  sodium chloride infusion   Intravenous Once Truitt Merle, MD        PHYSICAL EXAMINATION: ECOG PERFORMANCE STATUS: 3 - Symptomatic, >50% confined to bed  Vitals:   09/23/22 1158  BP:  91/64  Pulse: (!) 51  Resp: 15  Temp: 97.7 F (36.5 C)  SpO2: 97%   Wt Readings from Last 3 Encounters:  09/21/22 160 lb (72.6 kg)  09/14/22 158 lb 6 oz (71.8 kg)  09/14/22 158 lb 9.6 oz (71.9 kg)     GENERAL:alert, no distress and comfortable SKIN: skin color, texture, turgor are normal, no rashes or significant lesions EYES: normal, Conjunctiva are pink and non-injected, sclera clear  LUNGS: clear to auscultation and percussion with normal breathing effort HEART: regular rate & rhythm and no murmurs and no lower extremity edema ABDOMEN:abdomen soft, non-tender and normal bowel sounds Musculoskeletal:no cyanosis of digits and no clubbing  NEURO: alert & oriented x 3 with fluent speech, no focal motor/sensory deficits  LABORATORY DATA:  I have reviewed the data as listed    Latest Ref Rng & Units 09/23/2022   11:32 AM 09/21/2022    1:08 PM 09/21/2022   11:26 AM  CBC  WBC 4.0 - 10.5 K/uL 7.3  7.0  6.4   Hemoglobin 13.0 - 17.0 g/dL 12.2  12.2  11.9   Hematocrit 39.0 - 52.0 % 35.8  35.5  34.8   Platelets 150 - 400  K/uL 162  222  177         Latest Ref Rng & Units 09/23/2022   11:32 AM 09/21/2022    2:20 PM 09/21/2022   11:26 AM  CMP  Glucose 70 - 99 mg/dL 178  112  155   BUN 8 - 23 mg/dL 38  32  32   Creatinine 0.61 - 1.24 mg/dL 1.17  1.25  1.12   Sodium 135 - 145 mmol/L 134  134  134   Potassium 3.5 - 5.1 mmol/L 4.6  4.8  4.9   Chloride 98 - 111 mmol/L 102  101  102   CO2 22 - 32 mmol/L 26  24  26    Calcium 8.9 - 10.3 mg/dL 8.3  8.3  8.2   Total Protein 6.5 - 8.1 g/dL 5.5  5.3  5.5   Total Bilirubin 0.3 - 1.2 mg/dL 6.3  6.0  6.3   Alkaline Phos 38 - 126 U/L 592  486  532   AST 15 - 41 U/L 103  83  80   ALT 0 - 44 U/L 69  67  65       RADIOGRAPHIC STUDIES: I have personally reviewed the radiological images as listed and agreed with the findings in the report. IR BILIARY STENT(S) NEW ACCESS WITH DRAIN  Result Date: 09/21/2022 INDICATION: 81 year old male with history of metastatic colon cancer to the liver resulting in malignant right biliary obstruction. Patient seeking palliative approach. EXAM: 1. Ultrasound-guided percutaneous access of the right biliary tree. 2. Percutaneous cholangiogram. 3. Biliary stent placement. 4. Coil embolization of peripheral intrahepatic access tract. MEDICATIONS: Cefoxitin, 2 g, intravenous; The antibiotic was administered within an appropriate time frame prior to the initiation of the procedure. ANESTHESIA/SEDATION: Moderate (conscious) sedation was employed during this procedure. A total of Versed 1 mg and Fentanyl 50 mcg was administered intravenously by the radiology nurse. Total intra-service moderate Sedation Time: 43 minutes. The patient's level of consciousness and vital signs were monitored continuously by radiology nursing throughout the procedure under my direct supervision. FLUOROSCOPY: Radiation Exposure Index (as provided by the fluoroscopic device): 78 mGy Kerma COMPLICATIONS: None immediate. PROCEDURE: Informed written consent was obtained from  the patient after a thorough discussion of the procedural risks, benefits and alternatives.  All questions were addressed. Maximal Sterile Barrier Technique was utilized including caps, mask, sterile gowns, sterile gloves, sterile drape, hand hygiene and skin antiseptic. A timeout was performed prior to the initiation of the procedure. The right upper quadrant was prepped and draped in standard fashion. Preprocedure ultrasound evaluation demonstrated moderately distended right intrahepatic biliary tree. The procedure was planned. Subdermal Local anesthesia was provided at the planned needle entry site with 1% lidocaine. A small skin nick was made. Under direct ultrasound visualization, a tertiary right biliary radicle was accessed with a Leslie needle. There was immediate efflux of clear bile. Gentle hand injection of contrast demonstrated appropriate position within the bile ducts. A 0.018 inch wire was then inserted and directed to the central biliary tree where there with apparent obstruction. The needle was removed and exchanged for a transition Accustick set. The Accustick sec tip was directed toward the central right intrahepatic bile ducts. Repeat cholangiogram was performed which demonstrated hilar obstruction. An angled tip 5 French catheter and Glidewire were then used to traverse the occlusion and access the common bile duct, through the ampulla, and into the duodenum. The catheter was advanced through the segment. The Glidewire was exchanged for an Amplatz wire. A 7 French sheath was then inserted and pull-back sheath cholangiogram was performed. There is a high-grade stenosis versus obstruction about the biliary hilum measuring approximately 2.6 cm in length. There is trace opacification of the central left bile ducts which appear atrophic compatible with known diffuse left-sided metastatic disease. The sheath was removed. An 8 mm x 6 cm Viabil stent with into holes was then inserted and deployed  across the obstruction under fluoroscopic guidance. Post deployment balloon molding with an 8 mm x 4 mm Atheltis balloon was then performed. Completion cholangiogram demonstrated patency of the stent. An 8 [place] sheath was then reinserted, indwelling wire was removed, and the puncture site of the bile duct as well as the surface of the liver were identified with hand injection of contrast. Next, a 5 mm MReye coil was then deployed to embolize the subcapsular access tract. A sterile bandage was applied. The patient tolerated the procedure well was transferred to the recovery area in good condition. IMPRESSION: 1. 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 2. Successful placement of a partially covered biliary stent. PLAN: Follow-up with Interventional Radiology as needed. Ruthann Cancer, MD [facility] Radiology Electronically Signed   By: Ruthann Cancer M.D.   On: 09/21/2022 21:40      No orders of the defined types were placed in this encounter.  All questions were answered. The patient knows to call the clinic with any problems, questions or concerns. No barriers to learning was detected. The total time spent in the appointment was 40 minutes.     Truitt Merle, MD 09/23/2022   I, Wilburn Mylar, am acting as scribe for Truitt Merle, MD.   I have reviewed the above documentation for accuracy and completeness, and I agree with the above.

## 2022-09-23 NOTE — Progress Notes (Signed)
Critical lab value reported by [facility]. Tbili 6.3 today.  Dr. Burr Medico verbally notified.  No new orders given.

## 2022-09-24 ENCOUNTER — Encounter: Payer: Self-pay | Admitting: Radiation Oncology

## 2022-09-24 ENCOUNTER — Inpatient Hospital Stay: Payer: Medicare HMO

## 2022-09-24 ENCOUNTER — Ambulatory Visit
Admission: RE | Admit: 2022-09-24 | Discharge: 2022-09-24 | Disposition: A | Discharge status: Home / Self Care | Payer: Medicare HMO | Source: Ambulatory Visit | Attending: Radiation Oncology | Admitting: Radiation Oncology

## 2022-09-24 ENCOUNTER — Other Ambulatory Visit: Payer: Self-pay

## 2022-09-24 ENCOUNTER — Telehealth (HOSPITAL_COMMUNITY): Payer: Self-pay | Admitting: Physician Assistant

## 2022-09-24 DIAGNOSIS — Z510 Encounter for antineoplastic radiation therapy: Secondary | ICD-10-CM | POA: Diagnosis not present

## 2022-09-24 LAB — RAD ONC ARIA SESSION SUMMARY
Course Elapsed Days: 4
Plan Fractions Treated to Date: 5
Plan Fractions Treated to Date: 5
Plan Prescribed Dose Per Fraction: 4 Gy
Plan Prescribed Dose Per Fraction: 5 Gy
Plan Total Fractions Prescribed: 5
Plan Total Fractions Prescribed: 5
Plan Total Prescribed Dose: 20 Gy
Plan Total Prescribed Dose: 25 Gy
Reference Point Dosage Given to Date: 20 Gy
Reference Point Dosage Given to Date: 25 Gy
Reference Point Session Dosage Given: 4 Gy
Reference Point Session Dosage Given: 5 Gy
Session Number: 5

## 2022-09-24 NOTE — Progress Notes (Signed)
Patient ID: Eleanora Neighbor, male   DOB: Aug 04, 1941, 81 y.o.   MRN: [id number]  Received call from [place] at the [facility].  Mr. Sliter was being seen there for radiation treatment.  It was noticeable that he had yellow fluid coming through his shirt from the procedure site from 09/21/22.  Wife admits he has been leaking fluid from this site at home as well and has soiled bed sheets.  No other complaints.  After discussion with Dr. Serafina Royals, recommended the office re-dressing the procedure site and having the family monitor over the weekend.  If no improvement, they are to call IR for evaluation.  Latoya voiced understanding of the plan and will relay instructions to family.  Electronically Signed: Pasty Spillers, PA-C 09/24/2022, 3:46 PM

## 2022-09-24 NOTE — Progress Notes (Signed)
Called to the treatment machine for the patient with concerns for drainage from his side.  Patient was noted to have pouring drainage from a band-aid on his right side.  His t-shirt was noted to be saturated with yellow fluid and per his wife his sheets have been soaked the last couple of nights.  He was seen by IR on 09/21/2022 and had a percutaneous cholangiogram with biliary stent placement by Dr. Serafina Royals.  IR was called and we were informed the patient was noted to have some ascites that did not equate to enough for paracentesis.  Recommendation was to reinforce with a dressing and advise the family to monitor over the weekend and call Interventional radiology on Monday if the drainage becomes worse.  Wife and grandson verbalized understanding.  Patient's right side was dressed with folded 2 x 2 and ABD pad.  Supplies given to the family.  Gloriajean Dell. Leonie Green, BSN

## 2022-09-27 ENCOUNTER — Other Ambulatory Visit (HOSPITAL_COMMUNITY): Payer: Self-pay | Admitting: Interventional Radiology

## 2022-09-27 ENCOUNTER — Encounter: Payer: Self-pay | Admitting: Hematology

## 2022-09-27 ENCOUNTER — Other Ambulatory Visit: Payer: Self-pay | Admitting: Nurse Practitioner

## 2022-09-27 ENCOUNTER — Other Ambulatory Visit: Payer: Self-pay

## 2022-09-27 ENCOUNTER — Encounter (HOSPITAL_COMMUNITY): Payer: Self-pay

## 2022-09-27 DIAGNOSIS — R197 Diarrhea, unspecified: Secondary | ICD-10-CM

## 2022-09-27 DIAGNOSIS — R1012 Left upper quadrant pain: Secondary | ICD-10-CM

## 2022-09-27 DIAGNOSIS — R748 Abnormal levels of other serum enzymes: Secondary | ICD-10-CM

## 2022-09-27 DIAGNOSIS — R634 Abnormal weight loss: Secondary | ICD-10-CM

## 2022-09-27 HISTORY — PX: IR EMBO TUMOR ORGAN ISCHEMIA INFARCT INC GUIDE ROADMAPPING: IMG5449

## 2022-09-27 HISTORY — PX: IR CHOLANGIOGRAM EXISTING TUBE: IMG6040

## 2022-09-28 ENCOUNTER — Encounter: Payer: Self-pay | Admitting: Hematology

## 2022-09-30 ENCOUNTER — Ambulatory Visit: Payer: Medicare HMO | Admitting: Hematology

## 2022-09-30 ENCOUNTER — Other Ambulatory Visit: Payer: Medicare HMO

## 2022-09-30 ENCOUNTER — Encounter: Payer: Medicare HMO | Admitting: Nurse Practitioner

## 2022-09-30 ENCOUNTER — Other Ambulatory Visit: Payer: Self-pay | Admitting: Hematology

## 2022-09-30 ENCOUNTER — Ambulatory Visit: Payer: Medicare HMO

## 2022-09-30 DIAGNOSIS — K922 Gastrointestinal hemorrhage, unspecified: Secondary | ICD-10-CM

## 2022-10-04 ENCOUNTER — Inpatient Hospital Stay: Payer: Medicare HMO | Attending: Hematology | Admitting: Hematology

## 2022-10-04 ENCOUNTER — Other Ambulatory Visit: Payer: Self-pay

## 2022-10-04 ENCOUNTER — Telehealth: Payer: Self-pay

## 2022-10-04 DIAGNOSIS — C20 Malignant neoplasm of rectum: Secondary | ICD-10-CM

## 2022-10-04 NOTE — Progress Notes (Signed)
Oncology brief note   I called patient's daughter Juliann Pulse today.  Per Juliann Pulse, patient has been deteriorated significantly in the past week, he is bedbound, not eating much, needs total care.  He appears to be gray.  Patient and his family has agreed to transition to hospice care, hospice nurse was at their house when I called.  We briefly reviewed the symptom management, and I will communicate with his hospice nurse too.  Truitt Merle MD  10/04/2022

## 2022-10-04 NOTE — Telephone Encounter (Signed)
Oral Chemotherapy Pharmacist Encounter    Notified patient is transitioning to hospice. Oral chemotherapy will stop following at this time.   Leron Croak, PharmD, BCPS, [facility] Hematology/Oncology Clinical Pharmacist [facility] [place] [phone] 10/04/2022 10:16 AM

## 2022-10-04 NOTE — Telephone Encounter (Signed)
Spoke with Ceasar Mons via telephone regarding pt's status.  Juliann Pulse stated that the pt is grey and disoriented.  [facility] (Palliative Care) is seeing pt w/in the pt's home and has now transitioned him to home Hospice.  Pt is receiving comfort care per Juliann Pulse.  Pt will not start Capecitabine and per Tye [place] they will return them to the pharmacy.  Discontinued the order in Epic for Capecitabine.  Asked Juliann Pulse if she and her mom would like to have a phone visit with Dr. Burr Medico today.  Juliann Pulse stated "Yes".  Informed Juliann Pulse that Dr. Burr Medico will contact them after this afternoon after she's finish with clinic.  Juliann Pulse verbalized understanding and had no further questions.

## 2022-10-06 ENCOUNTER — Encounter: Payer: Self-pay | Admitting: Hematology

## 2022-10-06 NOTE — Progress Notes (Signed)
                                                                                                                                                             Patient Name: [name] MRN: [id number] DOB: 22-Mar-1941 Referring Physician: Truitt Merle (Profile Not Attached) Date of Service: 09/24/2022  [facility] [place]                                                        End Of Treatment Note  Diagnoses: C20-Malignant neoplasm of rectum  Cancer Staging: Stage IV Adenocarcinoma of the Rectum with liver metastases   Intent: Curative  Radiation Treatment Dates:  09/20/2022 through 09/24/2022 Site Technique Total Dose (Gy) Dose per Fx (Gy) Completed Fx Beam Energies  Pelvis: Pelvis 3D 25/25 5 5/5 15X  Liver: Liver 3D 20/20 4 5/5 6X, 15X   Narrative: The patient tolerated radiation therapy relatively well.    Plan: The patient will receive a call in about one month from the radiation oncology department. He will continue follow up with Dr. Burr Medico as well.   ________________________________________________    Carola Rhine, [facility]

## 2022-10-29 DEATH — deceased

## 2022-11-25 ENCOUNTER — Telehealth: Payer: Self-pay | Admitting: Genetic Counselor

## 2022-11-25 ENCOUNTER — Encounter: Payer: Self-pay | Admitting: Genetic Counselor

## 2022-11-25 NOTE — Telephone Encounter (Signed)
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 Murlean Caller passed away.  Discussed that it is unknown if this is germline or somatic in origin.  Family testing is recommended.  Juliann Pulse is a Psychiatrist of Mr. Winner and not biologically related. Mr. Burdell did not have biological children but had two sisters, one brother, and several nieces/nephews.  Juliann Pulse agreed to share a letter with appropriate family members to encourage family testing.
